# Patient Record
Sex: Male | Born: 1950 | Race: White | Hispanic: No | Smoking: Current some day smoker
Health system: Southern US, Community
[De-identification: ages and names within clinical notes are randomized; demographics above are authoritative.]

## PROBLEM LIST (undated history)

## (undated) DIAGNOSIS — H269 Unspecified cataract: Secondary | ICD-10-CM

## (undated) DIAGNOSIS — R569 Unspecified convulsions: Secondary | ICD-10-CM

## (undated) DIAGNOSIS — J449 Chronic obstructive pulmonary disease, unspecified: Secondary | ICD-10-CM

## (undated) DIAGNOSIS — F17200 Nicotine dependence, unspecified, uncomplicated: Secondary | ICD-10-CM

## (undated) HISTORY — PX: CATARACT EXTRACTION W/ INTRAOCULAR LENS  IMPLANT, BILATERAL: SHX1307

## (undated) HISTORY — DX: Chronic obstructive pulmonary disease, unspecified: J44.9

## (undated) HISTORY — DX: Unspecified convulsions: R56.9

## (undated) HISTORY — DX: Nicotine dependence, unspecified, uncomplicated: F17.200

## (undated) HISTORY — DX: Unspecified cataract: H26.9

---

## 1998-03-30 ENCOUNTER — Emergency Department (HOSPITAL_COMMUNITY): Admission: EM | Admit: 1998-03-30 | Discharge: 1998-03-30 | Payer: Self-pay | Admitting: Emergency Medicine

## 1998-03-30 ENCOUNTER — Encounter: Payer: Self-pay | Admitting: Emergency Medicine

## 1999-02-03 ENCOUNTER — Emergency Department (HOSPITAL_COMMUNITY): Admission: EM | Admit: 1999-02-03 | Discharge: 1999-02-03 | Payer: Self-pay | Admitting: Emergency Medicine

## 2013-01-26 ENCOUNTER — Encounter: Payer: Self-pay | Admitting: Family Medicine

## 2013-01-26 ENCOUNTER — Other Ambulatory Visit: Payer: Self-pay | Admitting: Family Medicine

## 2013-01-26 NOTE — Telephone Encounter (Signed)
Medication refill for one time only.  Patient needs to be seen.  Letter sent for patient to call and schedule 

## 2013-01-29 ENCOUNTER — Other Ambulatory Visit: Payer: Self-pay | Admitting: Family Medicine

## 2013-01-29 NOTE — Telephone Encounter (Signed)
This was refilled yesterday.

## 2013-01-31 ENCOUNTER — Other Ambulatory Visit: Payer: Self-pay | Admitting: Family Medicine

## 2013-02-01 ENCOUNTER — Other Ambulatory Visit: Payer: Self-pay | Admitting: Family Medicine

## 2013-02-01 NOTE — Telephone Encounter (Signed)
This is a quadruple request.  Pharmacy was called.  Did receive first request.  Told them stop resending

## 2013-02-01 NOTE — Telephone Encounter (Signed)
This has already been addressed

## 2013-02-01 NOTE — Telephone Encounter (Signed)
Pt NTBS  This was just refilled for one month and letter sent to pt to make appt.  Refill denied

## 2013-02-04 ENCOUNTER — Encounter: Payer: Self-pay | Admitting: Physician Assistant

## 2013-02-04 ENCOUNTER — Ambulatory Visit (INDEPENDENT_AMBULATORY_CARE_PROVIDER_SITE_OTHER): Payer: No Typology Code available for payment source | Admitting: Physician Assistant

## 2013-02-04 VITALS — BP 112/78 | HR 68 | Temp 98.5°F | Resp 18 | Ht 69.5 in | Wt 126.0 lb

## 2013-02-04 DIAGNOSIS — F17200 Nicotine dependence, unspecified, uncomplicated: Secondary | ICD-10-CM

## 2013-02-04 DIAGNOSIS — R569 Unspecified convulsions: Secondary | ICD-10-CM

## 2013-02-04 DIAGNOSIS — H269 Unspecified cataract: Secondary | ICD-10-CM | POA: Insufficient documentation

## 2013-02-04 MED ORDER — PHENYTOIN SODIUM EXTENDED 100 MG PO CAPS: 300.0000 mg | ORAL_CAPSULE | Freq: Every day | ORAL | Status: DC

## 2013-02-04 NOTE — Progress Notes (Signed)
   Patient ID: Franklin Flores MRN: [id number], DOB: 1950-12-25, 62 y.o. Date of Encounter: @DATE @  Chief Complaint:  Chief Complaint  Patient presents with  . 62 mth recheck for meds    HPI: 62 y.o. year oldwhite male  presents fro routine f/u. He has only been seen once at this office in the past. I saw him 07/06/12 to establish care here. Prior to that, he had been seeing Neurology-Dr. Jarrett Soho him sec to h/o seizures. Pt had no PCP. Dr. Anne Hahn told him he needed to have a PCP. Also told pt that PCP could monitor him regarding his seizure medication. He  Has had no seizure in well over 10 years, on Dilantin.  Today he reports he still has had no seizure over past year either.  He says he has completely stopped use of alcohol. He is in process of quitting smoking-on his own. Wass 1ppd LOV. Now < 2 packs per week. No complaints today.   Past Medical History  Diagnosis Date  . Seizures   . Smoker   . Cataract      Home Meds: See attached medication section for current medication list. Any medications entered into computer today will not appear on this note's list. The medications listed below were entered prior to today. No current outpatient prescriptions on file prior to visit.   No current facility-administered medications on file prior to visit.    Allergies: No Known Allergies  History   Social History  . Marital Status: Widowed    Spouse Name: N/A    Number of Children: N/A  . Years of Education: N/A   Occupational History  . Not on file.   Social History Main Topics  . Smoking status: Current Some Day Smoker  . Smokeless tobacco: Never Used  . Alcohol Use: Not on file  . Drug Use: No  . Sexually Active: Not on file   Other Topics Concern  . Not on file   Social History Narrative  . No narrative on file    No family history on file.   Review of Systems:  See HPI for pertinent ROS. All other ROS negative.    Physical Exam: Blood pressure 112/78, pulse  68, temperature 98.5 F (36.9 C), temperature source Oral, resp. rate 18, height 5' 9.5" (1.765 m), weight 126 lb (57.153 kg)., Body mass index is 18.35 kg/(m^2). General: Thin WM. Appears in no acute distress. Neck: Supple. No thyromegaly. No lymphadenopathy. Lungs: Clear bilaterally to auscultation without wheezes, rales, or rhonchi. Breathing is unlabored. Heart: RRR with S1 S2. No murmurs, rubs, or gallops. Musculoskeletal:  Strength and tone normal for age. Extremities/Skin: Warm and dry.  No edema.  Neuro: Alert and oriented X 3. Moves all extremities spontaneously. Gait is normal. CNII-XII grossly in tact. Psych:  Responds to questions appropriately with a normal affect.     ASSESSMENT AND PLAN:  62 y.o. year old male with  1. Seizures - phenytoin (DILANTIN) 100 MG ER capsule; Take 3 capsules (300 mg total) by mouth daily.  Dispense: 90 capsule; Refill: 11 - Phenytoin level, total  2. Smoker Handout given with tips for cessation.   Pt reports tha tNeuro only did lab once a year, only saw him for OV once a year. Wondring why we refused refill and why had to come for OV now.  F/U one year, sooner if needed.    [address] [place], [place], [facility] 02/04/2013 4:57 PM

## 2013-02-05 LAB — PHENYTOIN LEVEL, TOTAL: Phenytoin Lvl: 4.5 ug/mL — ABNORMAL LOW (ref 10.0–20.0)

## 2013-02-09 ENCOUNTER — Telehealth: Payer: Self-pay | Admitting: Family Medicine

## 2013-02-09 DIAGNOSIS — R569 Unspecified convulsions: Secondary | ICD-10-CM

## 2013-02-09 DIAGNOSIS — Z79899 Other long term (current) drug therapy: Secondary | ICD-10-CM

## 2013-02-09 MED ORDER — PHENYTOIN SODIUM EXTENDED 100 MG PO CAPS: 400.0000 mg | ORAL_CAPSULE | Freq: Every day | ORAL | Status: DC

## 2013-02-09 NOTE — Telephone Encounter (Signed)
Spoke to patient about low Dilantin level.  Understand to increase dose of Dilantin to 400 mg po daily.  Understands we need repeat dilantin levels in 1, 2 and 4 weeks.  Orders for med adjustment and lab work entered.

## 2013-02-09 NOTE — Telephone Encounter (Signed)
Message copied by Donne Anon on Tue Feb 09, 2013 12:48 PM ------      Message from: Allayne Butcher      Created: Mon Feb 08, 2013  7:38 AM       Pt's med list says he is taking Phenytoin 100mg  3 daily. (300mg ) . Verify that he has been taking this amount consistently, especially prior to lab draw. If he has been taking this, then have him increase to FOUR daily. Recheck level in 1 week, and again in 2 weeks, and in one month. If he has not been taking as directed, let me know. ------

## 2013-02-16 ENCOUNTER — Other Ambulatory Visit: Payer: No Typology Code available for payment source

## 2013-02-16 DIAGNOSIS — R569 Unspecified convulsions: Secondary | ICD-10-CM

## 2013-02-16 DIAGNOSIS — Z79899 Other long term (current) drug therapy: Secondary | ICD-10-CM

## 2013-02-19 ENCOUNTER — Telehealth: Payer: Self-pay | Admitting: Family Medicine

## 2013-02-19 NOTE — Telephone Encounter (Signed)
Message copied by Donne Anon on Fri Feb 19, 2013  9:45 AM ------      Message from: Allayne Butcher      Created: Wed Feb 17, 2013  3:22 PM       After last lab check, I increased dose slightly but now this level is even lower. Make sure he is taking increased does as directed. Get f/u labs as already planned. As well, see if he has gotten f/u apt with Neuro- I think we ordered this referral. ------

## 2013-02-19 NOTE — Telephone Encounter (Signed)
Pt called back.  Is taking increased Dilantin 400mg  QD in AM.  Does not have follow up appt with Dr Anne Hahn.  He had referred pt back to PCP because he was "stable"  And didn't feel needed to see any further.  Is scheduled for repeat labs on 03/02/13.

## 2013-02-19 NOTE — Telephone Encounter (Signed)
Ok. Cont this dose and recheck lab as planned

## 2013-02-19 NOTE — Telephone Encounter (Signed)
Message copied by Donne Anon on Fri Feb 19, 2013 12:40 PM ------      Message from: Allayne Butcher      Created: Wed Feb 17, 2013  3:22 PM       After last lab check, I increased dose slightly but now this level is even lower. Make sure he is taking increased does as directed. Get f/u labs as already planned. As well, see if he has gotten f/u apt with Neuro- I think we ordered this referral. ------

## 2013-02-23 ENCOUNTER — Telehealth: Payer: Self-pay | Admitting: Physician Assistant

## 2013-02-23 DIAGNOSIS — R569 Unspecified convulsions: Secondary | ICD-10-CM

## 2013-02-23 MED ORDER — PHENYTOIN SODIUM EXTENDED 100 MG PO CAPS: 400.0000 mg | ORAL_CAPSULE | Freq: Every day | ORAL | Status: DC

## 2013-02-23 NOTE — Telephone Encounter (Signed)
Medication refilled per protocol. 

## 2013-03-02 ENCOUNTER — Other Ambulatory Visit: Payer: No Typology Code available for payment source

## 2013-03-02 DIAGNOSIS — R569 Unspecified convulsions: Secondary | ICD-10-CM

## 2013-03-02 DIAGNOSIS — Z79899 Other long term (current) drug therapy: Secondary | ICD-10-CM

## 2013-03-02 LAB — PHENYTOIN LEVEL, TOTAL: Phenytoin Lvl: 3.7 ug/mL — ABNORMAL LOW (ref 10.0–20.0)

## 2013-03-03 ENCOUNTER — Telehealth: Payer: Self-pay | Admitting: Family Medicine

## 2013-03-03 NOTE — Telephone Encounter (Signed)
Called patient with dilantin level.  Per provider recommendation is to resume his 300 mg dose a day.  Still return for one month blood level draw.  He has not been having any seizure activity but told to report any activity to [place] immediately.  Pt acknowledges understanding of directions.  Also recommended that he ask his pharmacist to see if there is any interactions with anything he may be using (otc MVI/supplements) that can interfere with the absorption of his medication.

## 2013-03-03 NOTE — Telephone Encounter (Signed)
Message copied by Donne Anon on Wed Mar 03, 2013 12:35 PM ------      Message from: Allayne Butcher      Created: Wed Mar 03, 2013  7:59 AM       I discussed with Dr. Jeanice Lim.      She feels that if pt not having seizures (which he is not) then should cont current dose--go back to 300mg , dose that Neuro had him on-I think med list was changed to 400mg  based on prior lab level being low. Change this back to 300 mg. Just cont this dose. Make sure he is taking EVERY day and make sure to read package insert/discuss with pharmacist-to make sure something else he is taking or eating/drinking/supplements are not decreasing absorption of this medication-not sure why levels are reading so low. ------

## 2013-04-09 ENCOUNTER — Other Ambulatory Visit: Payer: Self-pay | Admitting: Physician Assistant

## 2013-04-09 NOTE — Telephone Encounter (Signed)
Medication refilled per protocol. 

## 2013-04-14 ENCOUNTER — Other Ambulatory Visit: Payer: Self-pay | Admitting: Physician Assistant

## 2013-04-14 NOTE — Telephone Encounter (Signed)
Medication refilled per protocol. 

## 2013-04-15 ENCOUNTER — Other Ambulatory Visit: Payer: Self-pay | Admitting: Physician Assistant

## 2013-04-15 NOTE — Telephone Encounter (Signed)
Medication refilled per protocol. 

## 2013-08-14 ENCOUNTER — Other Ambulatory Visit: Payer: Self-pay | Admitting: Physician Assistant

## 2013-08-14 DIAGNOSIS — G40909 Epilepsy, unspecified, not intractable, without status epilepticus: Secondary | ICD-10-CM

## 2013-08-16 NOTE — Telephone Encounter (Signed)
Medication refilled per protocol. 

## 2013-08-17 ENCOUNTER — Other Ambulatory Visit: Payer: Self-pay | Admitting: Physician Assistant

## 2013-08-18 ENCOUNTER — Other Ambulatory Visit: Payer: Self-pay | Admitting: Physician Assistant

## 2013-08-18 NOTE — Telephone Encounter (Signed)
This refill was done 08/16/13

## 2013-08-19 ENCOUNTER — Other Ambulatory Visit: Payer: Self-pay | Admitting: Physician Assistant

## 2013-08-19 NOTE — Telephone Encounter (Deleted)
Please

## 2013-08-19 NOTE — Telephone Encounter (Signed)
Refill appropriate and filled per protocol. 

## 2013-08-20 ENCOUNTER — Other Ambulatory Visit: Payer: Self-pay | Admitting: Physician Assistant

## 2013-08-20 NOTE — Telephone Encounter (Signed)
Agree. I double checked. Supposed to be on 300mg .

## 2013-08-20 NOTE — Telephone Encounter (Signed)
Pt is suppose to be on only 300mg  a day.  This was refilled on 08/16/13 #90 +2.    [facility] called and told not on 400mg .  Cancel those orders.  400mg  dose taken off medication list again!

## 2013-11-24 ENCOUNTER — Other Ambulatory Visit: Payer: Self-pay | Admitting: Physician Assistant

## 2013-11-25 ENCOUNTER — Other Ambulatory Visit: Payer: Self-pay | Admitting: Physician Assistant

## 2013-11-25 NOTE — Telephone Encounter (Signed)
Refill appropriate and filled per protocol. 

## 2013-11-26 NOTE — Telephone Encounter (Signed)
Medication refilled per protocol. 

## 2013-11-29 ENCOUNTER — Other Ambulatory Visit: Payer: Self-pay | Admitting: Physician Assistant

## 2013-11-29 NOTE — Telephone Encounter (Signed)
This was just refilled 11/26/13

## 2014-04-09 ENCOUNTER — Other Ambulatory Visit: Payer: Self-pay | Admitting: *Deleted

## 2014-04-09 MED ORDER — PHENYTOIN SODIUM EXTENDED 100 MG PO CAPS: ORAL_CAPSULE | ORAL | Status: DC

## 2014-04-09 NOTE — Telephone Encounter (Signed)
Medication filled x1 with no refills.   Requires office visit before any further refills can be given.   Letter sent.  

## 2014-04-11 ENCOUNTER — Encounter: Payer: Self-pay | Admitting: Family Medicine

## 2014-04-11 ENCOUNTER — Telehealth: Payer: Self-pay | Admitting: Family Medicine

## 2014-04-11 NOTE — Telephone Encounter (Signed)
Refill denied.  Pt last OV 01/2013.  NTBS  Letter was sent to him as reminder and no appt has been made.

## 2014-04-13 ENCOUNTER — Telehealth: Payer: Self-pay | Admitting: Family Medicine

## 2014-04-13 NOTE — Telephone Encounter (Signed)
med just refilled 10/10 verified with pharmacy

## 2014-04-27 ENCOUNTER — Ambulatory Visit (INDEPENDENT_AMBULATORY_CARE_PROVIDER_SITE_OTHER): Payer: No Typology Code available for payment source | Admitting: Physician Assistant

## 2014-04-27 ENCOUNTER — Encounter: Payer: Self-pay | Admitting: Physician Assistant

## 2014-04-27 VITALS — BP 126/80 | HR 68 | Temp 98.5°F | Resp 18 | Ht 69.5 in | Wt 125.0 lb

## 2014-04-27 DIAGNOSIS — R569 Unspecified convulsions: Secondary | ICD-10-CM

## 2014-04-27 DIAGNOSIS — F17200 Nicotine dependence, unspecified, uncomplicated: Secondary | ICD-10-CM

## 2014-04-27 DIAGNOSIS — Z720 Tobacco use: Secondary | ICD-10-CM

## 2014-04-27 MED ORDER — PHENYTOIN SODIUM EXTENDED 100 MG PO CAPS: ORAL_CAPSULE | ORAL | Status: DC

## 2014-04-27 NOTE — Progress Notes (Signed)
Patient ID: Franklin Flores MRN: [id number], DOB: 06-11-1951, 63 y.o. Date of Encounter: @DATE @  Chief Complaint:  Chief Complaint  Patient presents with  . med refills    hasn't been seen in over a year, has had cold  OK flu shot?    HPI: 63 y.o. year old white male  presents for routine f/u.   He has only been seen two times in this office in the past.   I saw him 07/06/12 to establish care here. Prior to that, he had been seeing Neurology-Dr. Jarrett SohoWillis--saw him sec to h/o seizures. Pt had no PCP. Dr. Anne HahnWillis told him he needed to have a PCP. Also told pt that PCP could monitor him regarding his seizure medication. He has had no seizure in well over 10 years, on Dilantin.   He had a 2nd OV with me 02/04/2013.That day he reported that he still had had no seizure over past year either.  He reported that  he had completely stopped use of alcohol.  Also he was in process of quitting smoking-on his own. Had been 1ppd at time of OV 07/2012. At OV 01/2013 was at  < 2 packs per week.  Today--04/27/2014--he states that he is taking his Dilantin as directed. He takes 3 pills each day and takes them every morning. He states that he still has had no seizure over the past year and has had no seizures and well over 12 years now. He is having no adverse effects to the Dilantin. He has no complaints today at all. I asked about the smoking and he says he is currently smoking 1/2-1 pack per day.     Past Medical History  Diagnosis Date  . Seizures   . Smoker   . Cataract      Home Meds:  Outpatient Prescriptions Prior to Visit  Medication Sig Dispense Refill  . Multiple Vitamins-Minerals (MULTIVITAMIN PO) Take 1 tablet by mouth daily. Centrum silver      . phenytoin (DILANTIN) 100 MG ER capsule TAKE 3 CAPSULES (300 MG TOTAL) BY MOUTH DAILY.  90 capsule  0   No facility-administered medications prior to visit.     Allergies: No Known Allergies  History   Social History  . Marital Status:  Widowed    Spouse Name: N/A    Number of Children: N/A  . Years of Education: N/A   Occupational History  . Not on file.   Social History Main Topics  . Smoking status: Current Some Day Smoker  . Smokeless tobacco: Never Used  . Alcohol Use: Not on file  . Drug Use: No  . Sexual Activity: Not on file   Other Topics Concern  . Not on file   Social History Narrative  . No narrative on file    No family history on file.   Review of Systems:  See HPI for pertinent ROS. All other ROS negative.    Physical Exam: Blood pressure 126/80, pulse 68, temperature 98.5 F (36.9 C), temperature source Oral, resp. rate 18, height 5' 9.5" (1.765 m), weight 125 lb (56.7 kg)., Body mass index is 18.2 kg/(m^2). General: Thin WM. Appears in no acute distress. Neck: Supple. No thyromegaly. No lymphadenopathy. Lungs: Clear bilaterally to auscultation without wheezes, rales, or rhonchi. Breathing is unlabored. Heart: RRR with S1 S2. No murmurs, rubs, or gallops. Musculoskeletal:  Strength and tone normal for age. Extremities/Skin: Warm and dry.  No edema.  Neuro: Alert and oriented X 3. Moves all extremities spontaneously.  Gait is normal. CNII-XII grossly in tact. Psych:  Responds to questions appropriately with a normal affect.     ASSESSMENT AND PLAN:  63 y.o. year old male with  1. Seizures - phenytoin (DILANTIN) 100 MG ER capsule; Take 3 capsules (300 mg total) by mouth daily.  Dispense: 90 capsule; Refill: 11 - Phenytoin level, total  2. Smoker Handout given with tips for cessation.   In past I only prescribed enough Dilantin to last 6 months. At f/u OV he reported that Neuro only did lab once a year, only saw him for OV once a year. Was wondering why we refused refill and why he had to come for OV 6 months after prior OV. Since then, have been seeing on annual basis.  Today--04/27/2014--- I discussed with patient the fact that so far he has been coming in for us to manage just this  one issue. Discussed with him scheduling a complete physical exams that we could check on other medical issues and do some preventive care. He is agreeable to come back for complete physical. States that he usually works 6:30 AM to 6:30 PM at least 5 days a week but does state that he does have time available to take a day off as needed. Discussed him scheduling his complete physical exam for early morning and come fasting to that appointment so that we can do his labs at the same time as that visit to minimize him taking time off work. He is agreeable with this and scheduled early morning CPE while here today.   [address]igned, Dakin Madani Beth [place], [place], [facility] 04/27/2014 4:41 PM

## 2014-04-29 LAB — PHENYTOIN LEVEL, TOTAL: Phenytoin Lvl: 3.3 ug/mL — ABNORMAL LOW (ref 10.0–20.0)

## 2014-05-06 ENCOUNTER — Telehealth: Payer: Self-pay | Admitting: Physician Assistant

## 2014-05-06 NOTE — Telephone Encounter (Signed)
Patient is calling us about his lab results  [phone]

## 2014-05-06 NOTE — Telephone Encounter (Signed)
Pt aware of lab results and provider recommendations 

## 2014-05-06 NOTE — Telephone Encounter (Signed)
-----   Message from Dorena BodoMary B Dixon, PA-C sent at 05/02/2014  7:59 AM EST ----- Phenytoin Level is low but stable compared to prior levels and pt has had no seizure in > 12 years.  Continue current dose. (FYI--At prior lab 1 year ago, I discussed it with Dr. Jeanice Limurham and she did not recommend increasing dose. Recommended cont dose the same)

## 2014-05-30 ENCOUNTER — Ambulatory Visit
Admission: RE | Admit: 2014-05-30 | Discharge: 2014-05-30 | Disposition: A | Discharge status: Home / Self Care | Payer: PRIVATE HEALTH INSURANCE | Source: Ambulatory Visit | Attending: Physician Assistant | Admitting: Physician Assistant

## 2014-05-30 ENCOUNTER — Ambulatory Visit (INDEPENDENT_AMBULATORY_CARE_PROVIDER_SITE_OTHER): Payer: No Typology Code available for payment source | Admitting: Physician Assistant

## 2014-05-30 ENCOUNTER — Telehealth: Payer: Self-pay | Admitting: *Deleted

## 2014-05-30 ENCOUNTER — Encounter: Payer: Self-pay | Admitting: Physician Assistant

## 2014-05-30 ENCOUNTER — Other Ambulatory Visit: Payer: Self-pay | Admitting: Physician Assistant

## 2014-05-30 VITALS — BP 136/78 | HR 72 | Temp 97.9°F | Resp 18 | Ht 69.0 in | Wt 122.0 lb

## 2014-05-30 DIAGNOSIS — R9389 Abnormal findings on diagnostic imaging of other specified body structures: Secondary | ICD-10-CM

## 2014-05-30 DIAGNOSIS — H269 Unspecified cataract: Secondary | ICD-10-CM

## 2014-05-30 DIAGNOSIS — Z23 Encounter for immunization: Secondary | ICD-10-CM

## 2014-05-30 DIAGNOSIS — F17200 Nicotine dependence, unspecified, uncomplicated: Secondary | ICD-10-CM

## 2014-05-30 DIAGNOSIS — J439 Emphysema, unspecified: Secondary | ICD-10-CM

## 2014-05-30 DIAGNOSIS — Z0000 Encounter for general adult medical examination without abnormal findings: Secondary | ICD-10-CM

## 2014-05-30 DIAGNOSIS — Z720 Tobacco use: Secondary | ICD-10-CM

## 2014-05-30 DIAGNOSIS — J449 Chronic obstructive pulmonary disease, unspecified: Secondary | ICD-10-CM | POA: Insufficient documentation

## 2014-05-30 DIAGNOSIS — R569 Unspecified convulsions: Secondary | ICD-10-CM

## 2014-05-30 LAB — TSH: TSH: 1.591 u[IU]/mL (ref 0.350–4.500)

## 2014-05-30 LAB — COMPLETE METABOLIC PANEL WITH GFR
ALT: 14 U/L (ref 0–53)
AST: 18 U/L (ref 0–37)
Albumin: 4.5 g/dL (ref 3.5–5.2)
Alkaline Phosphatase: 127 U/L — ABNORMAL HIGH (ref 39–117)
BUN: 14 mg/dL (ref 6–23)
CALCIUM: 10.1 mg/dL (ref 8.4–10.5)
CHLORIDE: 100 meq/L (ref 96–112)
CO2: 30 mEq/L (ref 19–32)
CREATININE: 0.74 mg/dL (ref 0.50–1.35)
GFR, Est Non African American: >89 mL/min
Glucose, Bld: 113 mg/dL — ABNORMAL HIGH (ref 70–99)
Potassium: 5.9 mEq/L — ABNORMAL HIGH (ref 3.5–5.3)
Sodium: 138 mEq/L (ref 135–145)
Total Bilirubin: 0.4 mg/dL (ref 0.2–1.2)
Total Protein: 7.2 g/dL (ref 6.0–8.3)

## 2014-05-30 LAB — CBC WITH DIFFERENTIAL/PLATELET
Basophils Absolute: 0.1 10*3/uL (ref 0.0–0.1)
Basophils Relative: 1 % (ref 0–1)
EOS ABS: 0.5 10*3/uL (ref 0.0–0.7)
Eosinophils Relative: 6 % — ABNORMAL HIGH (ref 0–5)
HEMATOCRIT: 44.9 % (ref 39.0–52.0)
Hemoglobin: 16.3 g/dL (ref 13.0–17.0)
LYMPHS ABS: 2.1 10*3/uL (ref 0.7–4.0)
Lymphocytes Relative: 27 % (ref 12–46)
MCH: 34.1 pg — ABNORMAL HIGH (ref 26.0–34.0)
MCHC: 36.3 g/dL — ABNORMAL HIGH (ref 30.0–36.0)
MCV: 93.9 fL (ref 78.0–100.0)
MONO ABS: 0.8 10*3/uL (ref 0.1–1.0)
MONOS PCT: 10 % (ref 3–12)
MPV: 9.7 fL (ref 9.4–12.4)
Neutro Abs: 4.3 10*3/uL (ref 1.7–7.7)
Neutrophils Relative %: 56 % (ref 43–77)
Platelets: 266 10*3/uL (ref 150–400)
RBC: 4.78 MIL/uL (ref 4.22–5.81)
RDW: 13.8 % (ref 11.5–15.5)
WBC: 7.7 10*3/uL (ref 4.0–10.5)

## 2014-05-30 LAB — LIPID PANEL
Cholesterol: 240 mg/dL — ABNORMAL HIGH (ref 0–200)
HDL: 79 mg/dL (ref >39)
LDL CALC: 149 mg/dL — AB (ref 0–99)
TRIGLYCERIDES: 59 mg/dL (ref <150)
Total CHOL/HDL Ratio: 3 Ratio
VLDL: 12 mg/dL (ref 0–40)

## 2014-05-30 NOTE — Progress Notes (Signed)
Patient ID: Franklin Flores MRN: [id number], DOB: [birth year] 63 y.o. Date of Encounter: 05/30/2014, 8:34 AM    Chief Complaint: Physical (CPE)  HPI: 63 y.o. y/o white male here for CPE.   Prior to today's visit, he has always been seen here just to follow-up regarding his seizures and to refill his medication for seizures.  I saw him 07/06/2012 to establish care here. Prior to that, he had been seeing Dr. Anne Hahn at [facility] neurology secondary to history of seizures. Patient had no PCP. Dr. Anne Hahn told him he needed to have a PCP. He also told him that the PCP could monitor him regarding the seizure medication. He had had no seizure in well over 10 years, on Dilantin.  At his follow-up visits with me he has continued to report that he has continued to have no seizures.  Also at his visits with me at each visit he has been decreasing the amount of cigarette smoking. Today he says that he is down to one half pack a day. Says that he recently moved into a new house and he is not smoking in the house that that really limits the amount of smoking.  He says that he did drink alcohol fairly heavily in the past but says that that was over 30 years ago that he was drinking heavily. Then tapered off of the other liquor etc. down to only beer and he has not even had any beer in the past year.  He does have a very thin stature but says that he has always been thin and has always been this weight.   Review of Systems: Consitutional: No fever, chills, fatigue, night sweats, lymphadenopathy, or weight changes. Eyes: No visual changes, eye redness, or discharge. ENT/Mouth: Ears: No otalgia, tinnitus, hearing loss, discharge. Nose: No congestion, rhinorrhea, sinus pain, or epistaxis. Throat: No sore throat, post nasal drip, or teeth pain. Cardiovascular: No CP, palpitations, diaphoresis, DOE, edema, orthopnea, PND. Respiratory: No cough, hemoptysis, SOB, or wheezing. Gastrointestinal: No anorexia,  dysphagia, reflux, pain, nausea, vomiting, hematemesis, diarrhea, constipation, BRBPR, or melena. Genitourinary: No dysuria, frequency, urgency, hematuria, incontinence, nocturia, decreased urinary stream, discharge, impotence, or testicular pain/masses. Musculoskeletal: No decreased ROM, myalgias, stiffness, joint swelling, or weakness. Skin: No rash, erythema, lesion changes, pain, warmth, jaundice, or pruritis. Neurological: No headache, dizziness, syncope, seizures, tremors, memory loss, coordination problems, or paresthesias. Psychological: No anxiety, depression, hallucinations, SI/HI. Endocrine: No fatigue, polydipsia, polyphagia, polyuria, or known diabetes. All other systems were reviewed and are otherwise negative.  Past Medical History  Diagnosis Date  . Seizures   . Smoker   . Cataract   . Smoker   . COPD (chronic obstructive pulmonary disease)      Past Surgical History  Procedure Laterality Date  . Cataract extraction w/ intraocular lens  implant, bilateral Bilateral 07/02/2003, 07/01/2005    Dr. Stephannie Li    Home Meds:  Outpatient Prescriptions Prior to Visit  Medication Sig Dispense Refill  . Multiple Vitamins-Minerals (MULTIVITAMIN PO) Take 1 tablet by mouth daily. Centrum silver    . phenytoin (DILANTIN) 100 MG ER capsule TAKE 3 CAPSULES (300 MG TOTAL) BY MOUTH DAILY. 90 capsule 11   No facility-administered medications prior to visit.    Allergies: No Known Allergies  History   Social History  . Marital Status: Widowed    Spouse Name: N/A    Number of Children: N/A  . Years of Education: N/A   Occupational History  . Not on file.   Social  History Main Topics  . Smoking status: Current Some Day Smoker -- 0.50 packs/day for 47 years  . Smokeless tobacco: Never Used  . Alcohol Use: No  . Drug Use: No  . Sexual Activity: Not on file   Other Topics Concern  . Not on file   Social History Narrative   Entered 05/2014:      Married.   Says  he has no children with this wife.   Says he has a daughter who is almost 61 years old.   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   Usually works 6:30 AM to 6:30 PM at least 5 days a week.   Has DOT Physical every 4 years.      He says that he drank alcohol heavily about 30 years ago. Then tapered off of liquor and was only drinking beer. Has drank no beer or any type of alcohol in the past one year.      He has been decreasing his amount of smoking. To one half pack per day now.     Family History  Problem Relation Age of Onset  . Drug abuse Brother   . Alcohol abuse Brother     Physical Exam: Blood pressure 136/78, pulse 72, temperature 97.9 F (36.6 C), temperature source Oral, resp. rate 18, height 5\' 9"  (1.753 m), weight 122 lb (55.339 kg).  General: Thin WM. Appears in no acute distress. HEENT: Normocephalic, atraumatic. Conjunctiva pink, sclera non-icteric. Pupils 2 mm constricting to 1 mm, round, regular, and equally reactive to light and accomodation. EOMI. Internal auditory canal clear. TMs with good cone of light and without pathology. Nasal mucosa pink. Nares are without discharge. No sinus tenderness. Oral mucosa pink. Pharynx without exudate.   Neck: Supple. Trachea midline. No thyromegaly. Full ROM. No lymphadenopathy.No carotid bruits.  Lungs: Breath sounds are distant/decreased throughout but clear. I hear no wheezes or rhonchi. Cardiovascular: RRR with S1 S2. No murmurs, rubs, or gallops. Distal pulses 2+ symmetrically. No carotid or abdominal bruits.2+ Bilateral Posterior Tibial Pulses. No LE edema.  Abdomen: Soft, non-tender, non-distended with normoactive bowel sounds. No hepatosplenomegaly or masses. No rebound/guarding. No CVA tenderness. No hernias. He has  protrusion of a right low rib. He says that this is secondary to a past rib fracture that healed this way. Rectal:Deferred Musculoskeletal: Full range of motion and 5/5 strength throughout. Without  swelling. Skin: Warm and moist without erythema, ecchymosis, wounds, or rash. Neuro: A+Ox3. CN II-XII grossly intact. Moves all extremities spontaneously. Full sensation throughout. Normal gait.  Psych:  Responds to questions appropriately with a normal affect.   Assessment/Plan:  63 y.o. y/o [place] here for CPE -1. Visit for preventive health examination  A. Screening Labs:  - CBC with Differential - COMPLETE METABOLIC PANEL WITH GFR - Lipid panel - TSH - Vit D  25 hydroxy (rtn osteoporosis monitoring) - PSA  B. Screening For Prostate Cancer: - PSA  C. Screening For Colorectal Cancer:  He has never had a screening colonoscopy. Today I discussed this with him and explained it to him. However, because of his work schedule and the fact that he does not have someone to drive him to and from the colonoscopy procedure he does not want us to schedule this right now. I told him that if he is able to plan days off of work and arrange for transportation for the procedure to call me and we can do the referral 90 any time.  Also discussed Hemoccults stools  that deferred at this time.  D. Screening for Lung Cancer--Smoker Given his smoking history I recommend chest x-ray and he is agreeable. - DG Chest 2 View; Future    D. Immunizations: Flu------- he received influenza vaccine 05/17/2014 Tetanus--- he has had no T gap in well over 10 years and is agreeable to receive this today. DTaP given here 05/30/14 Pneumococcal--- he has received no pneumonia vaccine. Given that he is a smoker he needs to receive Pneumovax 23 today and then get the Prevnar 13 at age 63. He is agreeable. Pneumovax 23 given here 05/30/14 Zostavax--I wrote down on his AVS for him to call his insurance and find out the cost of the Zostavax/shingles vaccine. If he is agreeable to pay this price than he is to call us and we will send order to his pharmacy for him to receive the vaccine there.    2. Seizures He  just had office visit regarding this and phenytoin lab level drawn on 04/27/14. He is continued to have no seizures.  3. Smoker I have given him a handout with tips for cessation at past visits. Can not prescribe medications to help with smoking cessation given his history of seizures and his seizure medication. - DG Chest 2 View; Future  4. Pulmonary emphysema, unspecified emphysema type - DG Chest 2 View; Future  5. Cataract He says that he has had cataract extraction and lens implants on both eyes performed. Says that he currently only sees ophthalmologist every 4 years,"when due for his DOT" Recommended annual follow-up with ophthalmologist.   Follow-up for routine office visit in 6 months. At that visit in addition to checking a phenytoin level will rediscuss Hemoccult cards colonoscopy and the Zostavax to follow these issues.  Signed:   [address]Mary Beth Bald Kn[place], [place]  05/30/2014 8:34 AM

## 2014-05-30 NOTE — Telephone Encounter (Signed)
I have reviewed chest x-ray report. I have discussed with our referral nurse and she has discussed with the patient and is in the process of scheduling the CT scan.

## 2014-05-30 NOTE — Telephone Encounter (Signed)
Received call from [facility] with a call report stating that pt has chest xray and the impression was Emphysema and probable 8mm  Left upper lobe pulm nodule, recommends CT wo cm as a f/u.

## 2014-05-31 LAB — VITAMIN D 25 HYDROXY (VIT D DEFICIENCY, FRACTURES): Vit D, 25-Hydroxy: 28 ng/mL — ABNORMAL LOW (ref 30–100)

## 2014-05-31 LAB — PSA: PSA: 0.26 ng/mL (ref <=4.00)

## 2014-06-07 ENCOUNTER — Ambulatory Visit (HOSPITAL_COMMUNITY)
Admission: RE | Admit: 2014-06-07 | Discharge: 2014-06-07 | Disposition: A | Discharge status: Home / Self Care | Payer: PRIVATE HEALTH INSURANCE | Source: Ambulatory Visit | Attending: Physician Assistant | Admitting: Physician Assistant

## 2014-06-07 DIAGNOSIS — R918 Other nonspecific abnormal finding of lung field: Secondary | ICD-10-CM | POA: Diagnosis not present

## 2014-06-07 DIAGNOSIS — R9389 Abnormal findings on diagnostic imaging of other specified body structures: Secondary | ICD-10-CM

## 2014-06-13 ENCOUNTER — Telehealth: Payer: Self-pay | Admitting: Family Medicine

## 2014-06-13 DIAGNOSIS — J449 Chronic obstructive pulmonary disease, unspecified: Secondary | ICD-10-CM

## 2014-06-13 MED ORDER — TIOTROPIUM BROMIDE MONOHYDRATE 18 MCG IN CAPS: 18.0000 ug | ORAL_CAPSULE | Freq: Every day | RESPIRATORY_TRACT | Status: DC

## 2014-06-13 NOTE — Telephone Encounter (Signed)
Pt made aware of lab, CXR and CT results.  Given provider recommendations.  Rx for Spiriva to pharmacy

## 2014-06-13 NOTE — Telephone Encounter (Signed)
-----   Message from Dorena BodoMary B Dixon, PA-C sent at 06/08/2014 11:00 AM EST ----- Good News!! CT only shows COPD--no other abnormality.  Regarding the COPD--can start medication for this--Spiriva--1 inhalation daily #1 --11 refills. Also see Result Note attached to labs when call pt to avoid 2 phone calls.

## 2014-06-13 NOTE — Telephone Encounter (Signed)
-----   Message from Dorena BodoMary B Dixon, PA-C sent at 06/08/2014 11:00 AM EST ----- F/U CT was performed and result in Epic.

## 2014-06-13 NOTE — Telephone Encounter (Signed)
-----   Message from Dorena BodoMary B Dixon, PA-C sent at 06/08/2014 11:06 AM EST ----- WHEN CALL PT WITH THESE RESULTS, ALSO DISCUSS CXR AND CHEST CT RESULTS--- Tell patient that labs look good. 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 Regarding his cholesterol--tell him to decrease saturated fats in his diet including fried foods, butter, whole cream products.(his only CRF is male, age, smoker) Remainder of labs normal.

## 2015-05-04 ENCOUNTER — Other Ambulatory Visit: Payer: Self-pay | Admitting: Physician Assistant

## 2015-05-05 ENCOUNTER — Encounter: Payer: Self-pay | Admitting: Family Medicine

## 2015-05-05 NOTE — Telephone Encounter (Signed)
Medication refill for one time only.  Patient needs to be seen.  Letter sent for patient to call and schedule 

## 2015-05-17 ENCOUNTER — Telehealth: Payer: Self-pay | Admitting: Physician Assistant

## 2015-05-17 MED ORDER — PHENYTOIN SODIUM EXTENDED 100 MG PO CAPS: 300.0000 mg | ORAL_CAPSULE | Freq: Every day | ORAL | Status: DC

## 2015-05-17 NOTE — Telephone Encounter (Signed)
Medication refilled per protocol. 

## 2015-05-17 NOTE — Telephone Encounter (Signed)
Patient needs additional refill on his dilantin until his appointment in December if possible  [facility] [address] [phone] (H)

## 2015-05-29 ENCOUNTER — Other Ambulatory Visit: Payer: No Typology Code available for payment source

## 2015-05-30 ENCOUNTER — Other Ambulatory Visit: Payer: Self-pay | Admitting: Physician Assistant

## 2015-05-30 ENCOUNTER — Other Ambulatory Visit: Payer: Managed Care, Other (non HMO)

## 2015-05-30 DIAGNOSIS — Z0000 Encounter for general adult medical examination without abnormal findings: Secondary | ICD-10-CM

## 2015-05-30 DIAGNOSIS — Z79899 Other long term (current) drug therapy: Secondary | ICD-10-CM

## 2015-05-30 DIAGNOSIS — G40909 Epilepsy, unspecified, not intractable, without status epilepticus: Secondary | ICD-10-CM

## 2015-05-30 LAB — LIPID PANEL
CHOLESTEROL: 167 mg/dL (ref 125–200)
HDL: 93 mg/dL (ref >=40)
LDL CALC: 65 mg/dL (ref <130)
TRIGLYCERIDES: 47 mg/dL (ref <150)
Total CHOL/HDL Ratio: 1.8 Ratio (ref <=5.0)
VLDL: 9 mg/dL (ref <30)

## 2015-05-30 LAB — COMPLETE METABOLIC PANEL WITH GFR
ALBUMIN: 4.1 g/dL (ref 3.6–5.1)
ALK PHOS: 117 U/L — AB (ref 40–115)
ALT: 12 U/L (ref 9–46)
AST: 18 U/L (ref 10–35)
BUN: 14 mg/dL (ref 7–25)
CALCIUM: 9.3 mg/dL (ref 8.6–10.3)
CO2: 27 mmol/L (ref 20–31)
Chloride: 99 mmol/L (ref 98–110)
Creat: 0.78 mg/dL (ref 0.70–1.25)
GFR, Est African American: >89 mL/min (ref >=60)
Glucose, Bld: 89 mg/dL (ref 70–99)
POTASSIUM: 4.6 mmol/L (ref 3.5–5.3)
SODIUM: 136 mmol/L (ref 135–146)
Total Bilirubin: 0.4 mg/dL (ref 0.2–1.2)
Total Protein: 6.6 g/dL (ref 6.1–8.1)

## 2015-05-30 LAB — CBC WITH DIFFERENTIAL/PLATELET
BASOS PCT: 1 % (ref 0–1)
Basophils Absolute: 0.1 10*3/uL (ref 0.0–0.1)
Eosinophils Absolute: 0.2 10*3/uL (ref 0.0–0.7)
Eosinophils Relative: 2 % (ref 0–5)
HEMATOCRIT: 42.5 % (ref 39.0–52.0)
HEMOGLOBIN: 14.8 g/dL (ref 13.0–17.0)
LYMPHS ABS: 1.5 10*3/uL (ref 0.7–4.0)
Lymphocytes Relative: 18 % (ref 12–46)
MCH: 34.1 pg — AB (ref 26.0–34.0)
MCHC: 34.8 g/dL (ref 30.0–36.0)
MCV: 97.9 fL (ref 78.0–100.0)
MONO ABS: 0.6 10*3/uL (ref 0.1–1.0)
MONOS PCT: 8 % (ref 3–12)
MPV: 9.7 fL (ref 8.6–12.4)
NEUTROS ABS: 5.8 10*3/uL (ref 1.7–7.7)
NEUTROS PCT: 71 % (ref 43–77)
Platelets: 255 10*3/uL (ref 150–400)
RBC: 4.34 MIL/uL (ref 4.22–5.81)
RDW: 13.5 % (ref 11.5–15.5)
WBC: 8.1 10*3/uL (ref 4.0–10.5)

## 2015-05-30 LAB — TSH: TSH: 1.098 u[IU]/mL (ref 0.350–4.500)

## 2015-05-30 NOTE — Telephone Encounter (Signed)
Medication refilled per protocol. 

## 2015-05-31 LAB — PHENYTOIN LEVEL, TOTAL: Phenytoin Lvl: 3.6 ug/mL — ABNORMAL LOW (ref 10.0–20.0)

## 2015-06-01 ENCOUNTER — Encounter: Payer: Self-pay | Admitting: Physician Assistant

## 2015-06-01 ENCOUNTER — Ambulatory Visit (INDEPENDENT_AMBULATORY_CARE_PROVIDER_SITE_OTHER): Payer: Managed Care, Other (non HMO) | Admitting: Physician Assistant

## 2015-06-01 VITALS — BP 130/70 | HR 68 | Temp 98.4°F | Resp 18 | Ht 70.5 in | Wt 121.0 lb

## 2015-06-01 DIAGNOSIS — J439 Emphysema, unspecified: Secondary | ICD-10-CM

## 2015-06-01 DIAGNOSIS — Z0000 Encounter for general adult medical examination without abnormal findings: Secondary | ICD-10-CM | POA: Diagnosis not present

## 2015-06-01 DIAGNOSIS — R569 Unspecified convulsions: Secondary | ICD-10-CM

## 2015-06-01 DIAGNOSIS — F17200 Nicotine dependence, unspecified, uncomplicated: Secondary | ICD-10-CM

## 2015-06-01 DIAGNOSIS — Z720 Tobacco use: Secondary | ICD-10-CM

## 2015-06-01 NOTE — Progress Notes (Signed)
Patient ID: Franklin Flores MRN: [id number], DOB: 1950-09-27 64 y.o. Date of Encounter: 06/01/2015, 8:13 AM    Chief Complaint: Physical (CPE)  HPI: 64 y.o. y/o white male here for CPE.   Prior to his CPE 05/30/2014,  he had always been seen here just to follow-up regarding his seizures and to refill his medication for seizures.  I saw him 07/06/2012 to establish care here. Prior to that, he had been seeing Dr. Anne Hahn at [facility] secondary to history of seizures.  Patient had no PCP.      Dr. Anne Hahn told him he needed to have a PCP.   He also told him that the PCP could monitor him regarding the seizure medication.  He had had no seizure in well over 10 years, on Dilantin.  At his follow-up visits with me he has continued to report that he has continued to have no seizures.  Also at his visits with me at each visit he has been decreasing the amount of cigarette smoking. 05/30/2014-- he says that he is down to one half pack a day.                      Says that he recently moved into a new house and he is not smoking in the house that that really limits the amount of smoking. 06/01/2015--says that he has continued to decrease the smoking and is smoking less than half a pack a day now.  He says that he did drink alcohol fairly heavily in the past but says that that was over 30 years ago that he was drinking heavily. Then tapered off of the other liquor etc. down to only beer and he has not even had any beer in the past year.  He does have a very thin stature but says that he has always been thin and has always been this weight.  He has no specific complaints or concerns today. 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 Drives a box truck locally.   Review of Systems: Consitutional: No fever, chills, fatigue, night sweats, lymphadenopathy, or weight changes. Eyes: No visual changes, eye redness, or discharge. ENT/Mouth: Ears: No otalgia, tinnitus, hearing loss, discharge. Nose:  No congestion, rhinorrhea, sinus pain, or epistaxis. Throat: No sore throat, post nasal drip, or teeth pain. Cardiovascular: No CP, palpitations, diaphoresis, DOE, edema, orthopnea, PND. Respiratory: No cough, hemoptysis, SOB, or wheezing. Gastrointestinal: No anorexia, dysphagia, reflux, pain, nausea, vomiting, hematemesis, diarrhea, constipation, BRBPR, or melena. Genitourinary: No dysuria, frequency, urgency, hematuria, incontinence, nocturia, decreased urinary stream, discharge, impotence, or testicular pain/masses. Musculoskeletal: No decreased ROM, myalgias, stiffness, joint swelling, or weakness. Skin: No rash, erythema, lesion changes, pain, warmth, jaundice, or pruritis. Neurological: No headache, dizziness, syncope, seizures, tremors, memory loss, coordination problems, or paresthesias. Psychological: No anxiety, depression, hallucinations, SI/HI. Endocrine: No fatigue, polydipsia, polyphagia, polyuria, or known diabetes. All other systems were reviewed and are otherwise negative.  Past Medical History  Diagnosis Date  . Seizures   . Smoker   . Cataract   . Smoker   . COPD (chronic obstructive pulmonary disease)      Past Surgical History  Procedure Laterality Date  . Cataract extraction w/ intraocular lens  implant, bilateral Bilateral 07/02/2003, 07/01/2005    Dr. Stephannie Li    Home Meds:  Outpatient Prescriptions Prior to Visit  Medication Sig Dispense Refill  . Multiple Vitamins-Minerals (MULTIVITAMIN PO) Take 1 tablet by mouth daily. Centrum silver    . phenytoin (  DILANTIN) 100 MG ER capsule Take 3 capsules (300 mg total) by mouth daily. 90 capsule 0  . SPIRIVA HANDIHALER 18 MCG inhalation capsule PLACE 1 CAPSULE (18 MCG TOTAL) INTO INHALER AND INHALE DAILY. 30 capsule 5   No facility-administered medications prior to visit.    Allergies: No Known Allergies  Social History   Social History  . Marital Status: Widowed    Spouse Name: N/A  . Number of  Children: N/A  . Years of Education: N/A   Occupational History  . Not on file.   Social History Main Topics  . Smoking status: Current Some Day Smoker -- 0.50 packs/day for 47 years  . Smokeless tobacco: Never Used  . Alcohol Use: No  . Drug Use: No  . Sexual Activity: Not on file   Other Topics Concern  . Not on file   Social History Narrative   Entered 05/2014:      Married.   Says he has no children with this wife.   Says he has a daughter who is almost 62 years old.   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   Usually works 6:30 AM to 6:30 PM at least 5 days a week.   Has DOT Physical every 4 years.      He says that he drank alcohol heavily about 30 years ago. Then tapered off of liquor and was only drinking beer. Has drank no beer or any type of alcohol in the past one year.      He has been decreasing his amount of smoking. To one half pack per day now.     Family History  Problem Relation Age of Onset  . Drug abuse Brother   . Alcohol abuse Brother     Physical Exam: Blood pressure 130/70, pulse 68, temperature 98.4 F (36.9 C), temperature source Oral, resp. rate 18, height 5' 10.5" (1.791 m), weight 121 lb (54.885 kg).  General: Thin WM. Appears in no acute distress. HEENT: Normocephalic, atraumatic. Conjunctiva pink, sclera non-icteric. Pupils 2 mm constricting to 1 mm, round, regular, and equally reactive to light and accomodation. EOMI. Internal auditory canal clear. TMs with good cone of light and without pathology. Nasal mucosa pink. Nares are without discharge. No sinus tenderness. Oral mucosa pink. Pharynx without exudate.   Neck: Supple. Trachea midline. No thyromegaly. Full ROM. No lymphadenopathy.No carotid bruits.  Lungs: Breath sounds are distant/decreased throughout but clear. I hear no wheezes or rhonchi. Cardiovascular: RRR with S1 S2. No murmurs, rubs, or gallops. Distal pulses 2+ symmetrically. No carotid or abdominal bruits.2+ Bilateral  Posterior Tibial Pulses. No LE edema.  Abdomen: Soft, non-tender, non-distended with normoactive bowel sounds. No hepatosplenomegaly or masses. No rebound/guarding. No CVA tenderness. No hernias. He has  protrusion of a right low rib. He says that this is secondary to a past rib fracture that healed this way. Rectal:Deferred Musculoskeletal: Full range of motion and 5/5 strength throughout. Without swelling. Skin: Warm and moist without erythema, ecchymosis, wounds, or rash. Neuro: A+Ox3. CN II-XII grossly intact. Moves all extremities spontaneously. Full sensation throughout. Normal gait.  Psych:  Responds to questions appropriately with a normal affect.   Assessment/Plan:  64 y.o. y/[place] here for CPE -1. Visit for preventive health examination  A. Screening Labs: Came in had fasting labs done. These are all stable/normal. However PSA was not included in his labs so I have talked to the lab today for them to add a PSA.  B. Screening For  Prostate Cancer: - PSA  C. Screening For Colorectal Cancer:  He has never had a screening colonoscopy. Today I discussed this with him and explained it to him. However, because of his work schedule and the fact that he does not have someone to drive him to and from the colonoscopy procedure he does not want [place] to schedule this right now. I told him that if he is able to plan days off of work and arrange for transportation for the procedure to call me and we can do the referral 90 any time.  At his physical 05/2014 I discussed Hemoccults but he deferred. At his physical 05/2015, discussed Hemoccults again and he is agreeable to do these at this time. At this time he states that he still cannot get colonoscopy done because of his work schedule.  D. Screening for Lung Cancer--Smoker At CPE 05/2014---Given his smoking history I recommended chest x-ray and he is agreeable. --Chest x-ray performed 05/30/2014. Showed emphysema. Showed possible  pulmonary nodule and recommended follow-up CT. --He did undergo chest CT 06/07/14. Showed changes consistent with COPD. No nodule corresponds with the abnormality seen on recent chest x-ray. Left nipple was noted in that region may have contributed to     , 10/2/2016the density seen on the prior exam.    D. Immunizations: Flu------- he received influenza vaccine 05/17/2014, 04/02/2015 Tetanus--- TdaP given here 05/30/14 Pneumococcal--- Given that he is a smoker, Pneumovax 23 --given here 05/30/2104.                           ---Will give Prevnar 13 at age 48.  Zostavax--I wrote down on his AVS for him to call his insurance and find out the cost of the Zostavax/shingles vaccine. If he is agreeable to pay this price than he is to call us and we will send order to his pharmacy for him to receive the vaccine there.    2. Seizures He just had lab to check phenytoin level. This remains low but he has continued to have no seizures.  3. Smoker I have given him a handout with tips for cessation at past visits. Can not prescribe medications to help with smoking cessation given his history of seizures and his seizure medication. He has continued to reduce his smoking and is down to less than half a pack a day. He had chest x-ray and follow-up CT November 2015 and December 2015--- see above  4. Pulmonary emphysema, unspecified emphysema type -He had chest x-ray to screen for cancer given his smoking. Follow-up CT. He is on Spiriva and he states that this has helped. As well he has reduced his smoking. Less than a half a pack a day.  5. Cataract He says that he has had cataract extraction and lens implants on both eyes performed. Says that he currently only sees ophthalmologist every 4 years,"when due for his DOT" Recommended annual follow-up with ophthalmologist.    Signed:   [address] [place], [place]  06/01/2015 8:13 AM

## 2015-06-02 LAB — PSA: PSA: 0.22 ng/mL (ref <=4.00)

## 2015-06-06 ENCOUNTER — Other Ambulatory Visit: Payer: Managed Care, Other (non HMO)

## 2015-06-06 DIAGNOSIS — Z0000 Encounter for general adult medical examination without abnormal findings: Secondary | ICD-10-CM

## 2015-06-07 LAB — FECAL OCCULT BLOOD, IMMUNOCHEMICAL: Fecal Occult Blood: NEGATIVE

## 2015-06-12 ENCOUNTER — Other Ambulatory Visit: Payer: Self-pay | Admitting: Physician Assistant

## 2015-06-12 NOTE — Telephone Encounter (Signed)
Medication refilled per protocol. 

## 2015-06-16 ENCOUNTER — Other Ambulatory Visit: Payer: Managed Care, Other (non HMO)

## 2015-06-16 DIAGNOSIS — Z0000 Encounter for general adult medical examination without abnormal findings: Secondary | ICD-10-CM

## 2015-06-16 LAB — FECAL OCCULT BLOOD, GUAIAC: FECAL OCCULT BLD: NEGATIVE

## 2015-06-17 LAB — FECAL OCCULT BLOOD, IMMUNOCHEMICAL
FECAL OCCULT BLOOD: NEGATIVE
Fecal Occult Blood: NEGATIVE

## 2015-06-20 ENCOUNTER — Encounter: Payer: Self-pay | Admitting: Family Medicine

## 2015-11-26 ENCOUNTER — Other Ambulatory Visit: Payer: Self-pay | Admitting: Physician Assistant

## 2015-11-28 NOTE — Telephone Encounter (Signed)
Medication refilled per protocol. 

## 2016-02-27 ENCOUNTER — Other Ambulatory Visit: Payer: Self-pay | Admitting: Family Medicine

## 2016-02-27 MED ORDER — TIOTROPIUM BROMIDE MONOHYDRATE 18 MCG IN CAPS: 18.0000 ug | ORAL_CAPSULE | Freq: Every day | RESPIRATORY_TRACT | 5 refills | Status: DC

## 2016-02-27 NOTE — Telephone Encounter (Signed)
Medication refilled per protocol. 

## 2016-03-06 ENCOUNTER — Other Ambulatory Visit: Payer: Self-pay | Admitting: Family Medicine

## 2016-03-06 MED ORDER — TIOTROPIUM BROMIDE MONOHYDRATE 18 MCG IN CAPS: 18.0000 ug | ORAL_CAPSULE | Freq: Every day | RESPIRATORY_TRACT | 0 refills | Status: DC

## 2016-03-06 NOTE — Telephone Encounter (Signed)
Medication refilled per protocol. 

## 2016-05-23 ENCOUNTER — Other Ambulatory Visit: Payer: Self-pay | Admitting: Physician Assistant

## 2016-05-27 NOTE — Telephone Encounter (Signed)
Rx filled per protocol/ pt need office visit letter sent

## 2016-06-10 ENCOUNTER — Ambulatory Visit (INDEPENDENT_AMBULATORY_CARE_PROVIDER_SITE_OTHER): Payer: Managed Care, Other (non HMO) | Admitting: Physician Assistant

## 2016-06-10 ENCOUNTER — Encounter: Payer: Self-pay | Admitting: Physician Assistant

## 2016-06-10 VITALS — BP 130/78 | HR 70 | Temp 97.8°F | Resp 18 | Wt 118.0 lb

## 2016-06-10 DIAGNOSIS — Z0000 Encounter for general adult medical examination without abnormal findings: Secondary | ICD-10-CM | POA: Diagnosis not present

## 2016-06-10 DIAGNOSIS — R569 Unspecified convulsions: Secondary | ICD-10-CM | POA: Diagnosis not present

## 2016-06-10 LAB — COMPLETE METABOLIC PANEL WITH GFR
ALBUMIN: 4.2 g/dL (ref 3.6–5.1)
ALK PHOS: 97 U/L (ref 40–115)
ALT: 16 U/L (ref 9–46)
AST: 22 U/L (ref 10–35)
BILIRUBIN TOTAL: 0.4 mg/dL (ref 0.2–1.2)
BUN: 14 mg/dL (ref 7–25)
CALCIUM: 9.4 mg/dL (ref 8.6–10.3)
CO2: 29 mmol/L (ref 20–31)
Chloride: 100 mmol/L (ref 98–110)
Creat: 0.72 mg/dL (ref 0.70–1.25)
GFR, Est African American: >89 mL/min (ref >=60)
GLUCOSE: 105 mg/dL — AB (ref 70–99)
POTASSIUM: 4.7 mmol/L (ref 3.5–5.3)
SODIUM: 138 mmol/L (ref 135–146)
TOTAL PROTEIN: 6.5 g/dL (ref 6.1–8.1)

## 2016-06-10 LAB — TSH: TSH: 1.03 m[IU]/L (ref 0.40–4.50)

## 2016-06-10 LAB — CBC WITH DIFFERENTIAL/PLATELET
BASOS ABS: 48 {cells}/uL (ref 0–200)
Basophils Relative: 1 %
EOS PCT: 3 %
Eosinophils Absolute: 144 cells/uL (ref 15–500)
HEMATOCRIT: 45.6 % (ref 38.5–50.0)
HEMOGLOBIN: 15.8 g/dL (ref 13.0–17.0)
LYMPHS ABS: 1200 {cells}/uL (ref 850–3900)
Lymphocytes Relative: 25 %
MCH: 35.1 pg — AB (ref 27.0–33.0)
MCHC: 34.6 g/dL (ref 32.0–36.0)
MCV: 101.3 fL — ABNORMAL HIGH (ref 80.0–100.0)
MONO ABS: 480 {cells}/uL (ref 200–950)
MPV: 10 fL (ref 7.5–12.5)
Monocytes Relative: 10 %
NEUTROS ABS: 2928 {cells}/uL (ref 1500–7800)
NEUTROS PCT: 61 %
Platelets: 233 10*3/uL (ref 140–400)
RBC: 4.5 MIL/uL (ref 4.20–5.80)
RDW: 13.2 % (ref 11.0–15.0)
WBC: 4.8 10*3/uL (ref 3.8–10.8)

## 2016-06-10 LAB — PSA: PSA: 0.2 ng/mL (ref <=4.0)

## 2016-06-10 NOTE — Progress Notes (Signed)
Patient ID: Talmadge Ganas MRN: [id number], DOB: [birth year] 65 y.o. Date of Encounter: 06/10/2016, 8:46 AM    Chief Complaint: Physical (CPE)  HPI: 65 y.o. y/o white male here for CPE.   Prior to his CPE 05/30/2014,  he had always been seen here just to follow-up regarding his seizures and to refill his medication for seizures.  I saw him 07/06/2012 to establish care here. Prior to that, he had been seeing Dr. Anne Hahn at [facility] secondary to history of seizures.  Patient had no PCP.      Dr. Anne Hahn told him he needed to have a PCP.   He also told him that the PCP could monitor him regarding the seizure medication.  He had had no seizure in well over 10 years, on Dilantin.  At his follow-up visits with me he has continued to report that he has continued to have no seizures. 06/10/2016 states that he has continued to have no seizures.  Also at his visits with me at each visit he has been decreasing the amount of cigarette smoking. 05/30/2014-- he says that he is down to one half pack a day.                      Says that he recently moved into a new house and he is not smoking in the house that that really limits the amount of smoking. 06/01/2015--says that he has continued to decrease the smoking and is smoking less than half a pack a day now. 06/21/2016--- says that he has continued to decrease smoking. However says that he smoking a little less than a half a pack a day.  He says that he did drink alcohol fairly heavily in the past but says that that was over 30 years ago that he was drinking heavily. Then tapered off of the other liquor etc. down to only beer and he has not even had any beer in the past year. 06/10/2016 states that he may drink 3 or 4 beers while watching the football game on the weekend. Because the only alcohol per week.  He does have a very thin stature but says that he has always been thin and has always been this weight.  He has no specific complaints  or concerns today. 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 Drives a box truck locally.   Review of Systems: Consitutional: No fever, chills, fatigue, night sweats, lymphadenopathy, or weight changes. Eyes: No visual changes, eye redness, or discharge. ENT/Mouth: Ears: No otalgia, tinnitus, hearing loss, discharge. Nose: No congestion, rhinorrhea, sinus pain, or epistaxis. Throat: No sore throat, post nasal drip, or teeth pain. Cardiovascular: No CP, palpitations, diaphoresis, DOE, edema, orthopnea, PND. Respiratory: No cough, hemoptysis, SOB, or wheezing. Gastrointestinal: No anorexia, dysphagia, reflux, pain, nausea, vomiting, hematemesis, diarrhea, constipation, BRBPR, or melena. Genitourinary: No dysuria, frequency, urgency, hematuria, incontinence, nocturia, decreased urinary stream, discharge, impotence, or testicular pain/masses. Musculoskeletal: No decreased ROM, myalgias, stiffness, joint swelling, or weakness. Skin: No rash, erythema, lesion changes, pain, warmth, jaundice, or pruritis. Neurological: No headache, dizziness, syncope, seizures, tremors, memory loss, coordination problems, or paresthesias. Psychological: No anxiety, depression, hallucinations, SI/HI. Endocrine: No fatigue, polydipsia, polyphagia, polyuria, or known diabetes. All other systems were reviewed and are otherwise negative.  Past Medical History:  Diagnosis Date  . Cataract   . COPD (chronic obstructive pulmonary disease) (HCC)   . Seizures (HCC)   . Smoker   . Smoker  Past Surgical History:  Procedure Laterality Date  . CATARACT EXTRACTION W/ INTRAOCULAR LENS  IMPLANT, BILATERAL Bilateral 07/02/2003, 07/01/2005   Dr. Stephannie LiBeavis--[place]    Home Meds:  Outpatient Medications Prior to Visit  Medication Sig Dispense Refill  . Multiple Vitamins-Minerals (MULTIVITAMIN PO) Take 1 tablet by mouth daily. Centrum silver    . phenytoin (DILANTIN) 100 MG ER capsule TAKE 3 CAPSULES (300 MG TOTAL) BY MOUTH  DAILY. 90 capsule 0  . tiotropium (SPIRIVA HANDIHALER) 18 MCG inhalation capsule Place 1 capsule (18 mcg total) into inhaler and inhale daily. 90 capsule 0   No facility-administered medications prior to visit.     Allergies: No Known Allergies  Social History   Social History  . Marital status: Widowed    Spouse name: N/A  . Number of children: N/A  . Years of education: N/A   Occupational History  . Not on file.   Social History Main Topics  . Smoking status: Current Some Day Smoker    Packs/day: 0.50    Years: 47.00  . Smokeless tobacco: Never Used  . Alcohol use No  . Drug use: No  . Sexual activity: Not on file   Other Topics Concern  . Not on file   Social History Narrative   Entered 05/2014:      Married.   Says he has no children with this wife.   Says he has a daughter who is almost 63 years old.   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   Usually works 6:30 AM to 6:30 PM at least 5 days a week.   Has DOT Physical every 4 years.      He says that he drank alcohol heavily about 30 years ago. Then tapered off of liquor and was only drinking beer. Has drank no beer or any type of alcohol in the past one year.      He has been decreasing his amount of smoking. To one half pack per day now.     Family History  Problem Relation Age of Onset  . Drug abuse Brother   . Alcohol abuse Brother     Physical Exam: Blood pressure 130/78, pulse 70, temperature 97.8 F (36.6 C), temperature source Oral, resp. rate 18, weight 118 lb (53.5 kg), SpO2 96 %.  General: Thin WM. Appears in no acute distress. HEENT: Normocephalic, atraumatic. Conjunctiva pink, sclera non-icteric. Pupils 2 mm constricting to 1 mm, round, regular, and equally reactive to light and accomodation. EOMI. Internal auditory canal clear. TMs with good cone of light and without pathology. Nasal mucosa pink. Nares are without discharge. No sinus tenderness. Oral mucosa pink. Pharynx without exudate.      Neck: Supple. Trachea midline. No thyromegaly. Full ROM. No lymphadenopathy.No carotid bruits.  Lungs: Breath sounds are distant/decreased throughout but clear. I hear no wheezes or rhonchi. Cardiovascular: RRR with S1 S2. No murmurs, rubs, or gallops. Distal pulses 2+ symmetrically. No carotid or abdominal bruits.2+ Bilateral Posterior Tibial Pulses. No LE edema.  Abdomen: Soft, non-tender, non-distended with normoactive bowel sounds. No hepatosplenomegaly or masses. No rebound/guarding. No CVA tenderness. No hernias. He has  protrusion of a right low rib. He says that this is secondary to a past rib fracture that healed this way. Rectal:Deferred Musculoskeletal: Full range of motion and 5/5 strength throughout. Without swelling. Skin: Warm and moist without erythema, ecchymosis, wounds, or rash. Neuro: A+Ox3. CN II-XII grossly intact. Moves all extremities spontaneously. Full sensation throughout. Normal gait.  Psych:  Responds  to questions appropriately with a normal affect.   Assessment/Plan:  65 y.o. y/o [place] male here for CPE -1. Visit for preventive health examination  A. Screening Labs: 06/10/2016: He is not fasting today. Reviewed that his lipid panel 06/2015 was excellent with HDL 93 and LDL 65. We can just simply skip his lipid panel today and go ahead and recheck all other screening labs now.  B. Screening For Prostate Cancer: - PSA  C. Screening For Colorectal Cancer:  He has never had a screening colonoscopy. Today I discussed this with him and explained it to him. However, because of his work schedule and the fact that he does not have someone to drive him to and from the colonoscopy procedure he does not want [place] to schedule this right now. I told him that if he is able to plan days off of work and arrange for transportation for the procedure to call me and we can do the referral 90 any time.  At his physical 05/2014 I discussed Hemoccults but he deferred. At his  physical 05/2015, discussed Hemoccults again and he is agreeable to do these at this time. At this time he states that he still cannot get colonoscopy done because of his work schedule. 05/2015 he did turn in all 3 Hemoccults and they were all 3 negative. 06/10/2016----again today states he cannot do colonoscopy because of the same issues above. Again today is agreeable to do Hemoccult 3.  D. Screening for Lung Cancer--Smoker At CPE 05/2014---Given his smoking history I recommended chest x-ray and he is agreeable. --Chest x-ray performed 05/30/2014. Showed emphysema. Showed possible pulmonary nodule and recommended follow-up CT. --He did undergo chest CT 06/07/14. Showed changes consistent with COPD. No nodule corresponds with the abnormality seen on recent chest x-ray. Left nipple was noted in that region may have contributed to     , 10/2/2016the density seen on the prior exam.    D. Immunizations: Flu------- he received influenza vaccine 05/17/2014, 04/02/2015, 03/2016 Tetanus--- TdaP given here 05/30/14 Pneumococcal--- Given that he is a smoker, Pneumovax 23 --given here 05/30/2104.                           ---Will give Prevnar 13 at age 12.  Zostavax-- 06/10/2016: I wrote down on his AVS for him to call his insurance and find out the cost of the Zostavax/shingles vaccine. If he is agreeable to pay this price than he is to call us and we will send order to his pharmacy for him to receive the vaccine there.    2. Seizures 06/10/2016: He continues to have no seizures. He continues to take his phenytoin daily as directed. Will recheck phenytoin level now.  3. Smoker I have given him a handout with tips for cessation at past visits. Can not prescribe medications to help with smoking cessation given his history of seizures and his seizure medication. He has continued to reduce his smoking and is down to less than half a pack a day. He had chest x-ray and follow-up CT November 2015 and  December 2015--- see above  4. Pulmonary emphysema, unspecified emphysema type -He had chest x-ray to screen for cancer given his smoking. Follow-up CT. He is on Spiriva and he states that this has helped. As well he has reduced his smoking. Less than a half a pack a day.  5. Cataract He says that he has had cataract extraction and lens implants on both eyes  performed. Says that he currently only sees ophthalmologist every 4 years,"when due for his DOT" Recommended annual follow-up with ophthalmologist.    Signed:   [address]Tylique Aull Beth GrahamDixon,[place], [place]  06/10/2016 8:46 AM

## 2016-06-11 LAB — PHENYTOIN LEVEL, TOTAL: PHENYTOIN LVL: 4.8 ug/mL — AB (ref 10.0–20.0)

## 2016-06-17 ENCOUNTER — Other Ambulatory Visit: Payer: Managed Care, Other (non HMO)

## 2016-06-17 DIAGNOSIS — Z0000 Encounter for general adult medical examination without abnormal findings: Secondary | ICD-10-CM

## 2016-06-17 NOTE — Addendum Note (Signed)
Addended by: Phineas SemenJOHNSON, TIFFANY A on: 06/17/2016 03:14 PM   Modules accepted: Orders

## 2016-06-18 LAB — FECAL OCCULT BLOOD, IMMUNOCHEMICAL
Fecal Occult Blood: NEGATIVE
Fecal Occult Blood: NEGATIVE
Fecal Occult Blood: POSITIVE — AB

## 2016-06-24 ENCOUNTER — Other Ambulatory Visit: Payer: Self-pay | Admitting: Physician Assistant

## 2016-06-27 NOTE — Telephone Encounter (Signed)
rx filled per protocol  

## 2016-07-11 IMAGING — CT CT CHEST W/O CM
2 of 3 series · 15 of 36 positions shown, 18 images · non-contrast
Comparison: 05/30/2014

CLINICAL DATA: Abnormal chest x-ray

EXAM:
CT CHEST WITHOUT CONTRAST
TECHNIQUE: Multidetector CT imaging of the chest was performed following the
standard protocol without IV contrast..

[Series 2: chestroutine 5.0 b40f · axial · 0.64mm/px · z∈[-408,-83]mm · 12 of 77 slices shown, 15 images]
[im 6/77  mediastinal]
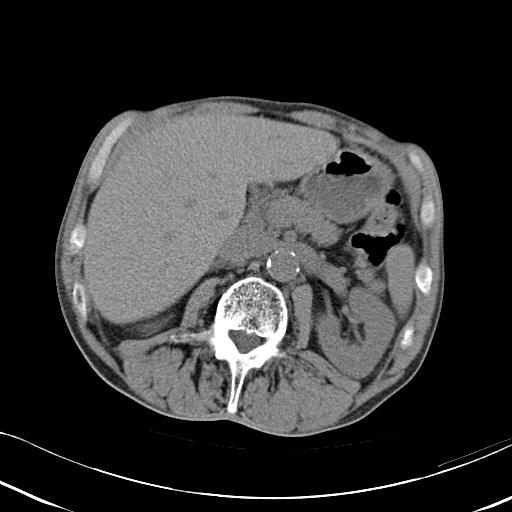
[im 6/77  lung]
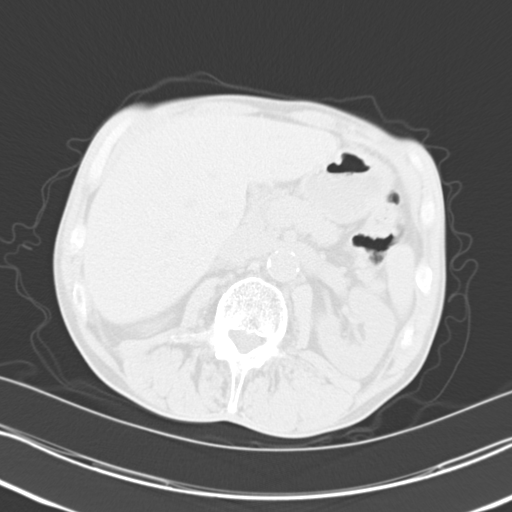
[im 12/77  lung]
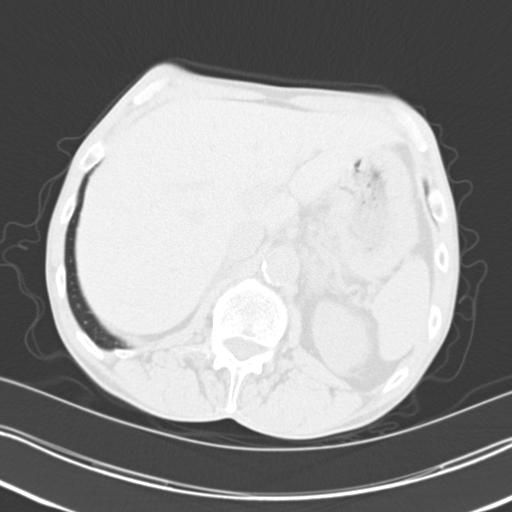
[im 17/77  lung]
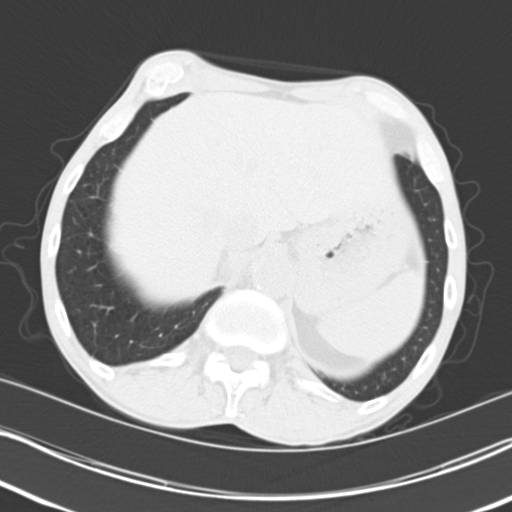
[im 23/77  lung]
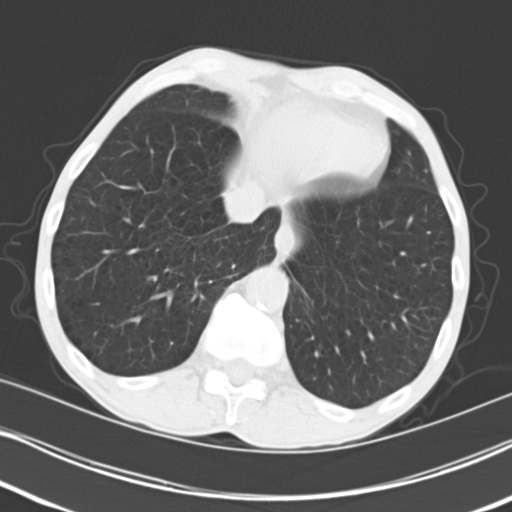
[im 29/77  mediastinal]
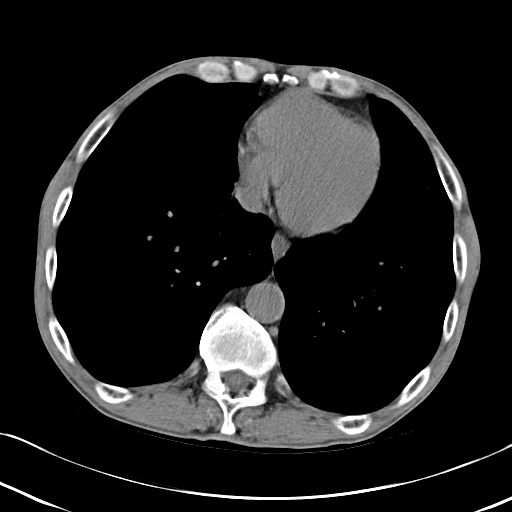
[im 29/77  lung]
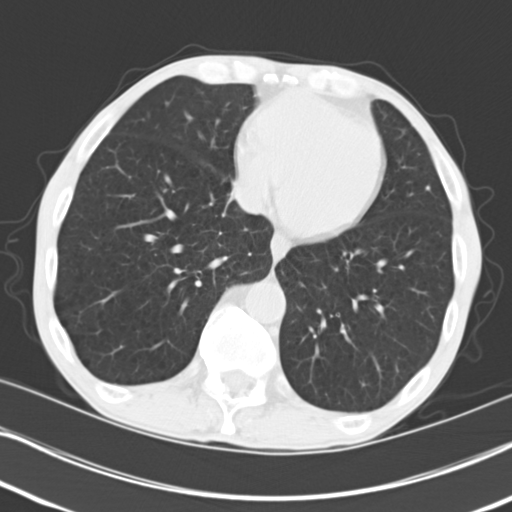
[im 34/77  lung]
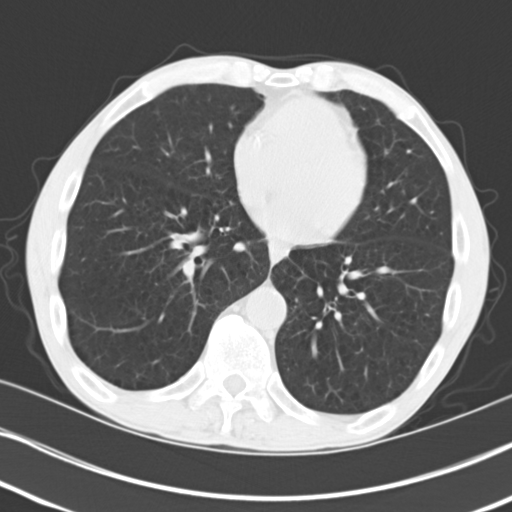
[im 43/77  lung]
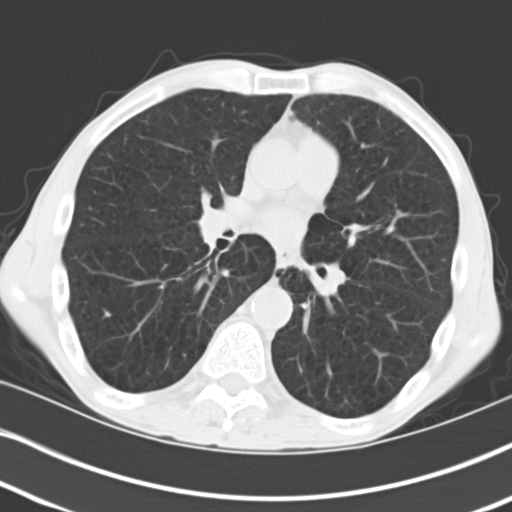
[im 48/77  lung]
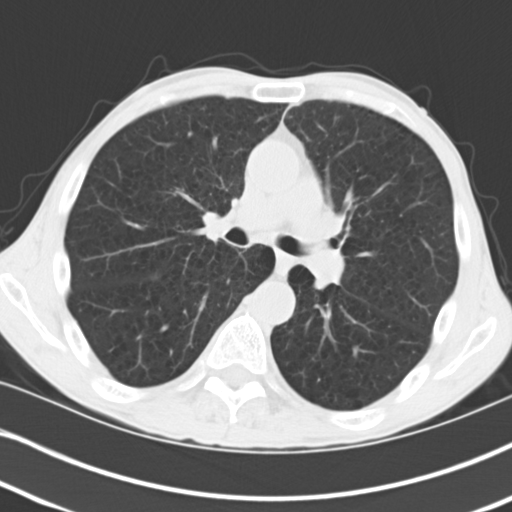
[im 54/77  mediastinal]
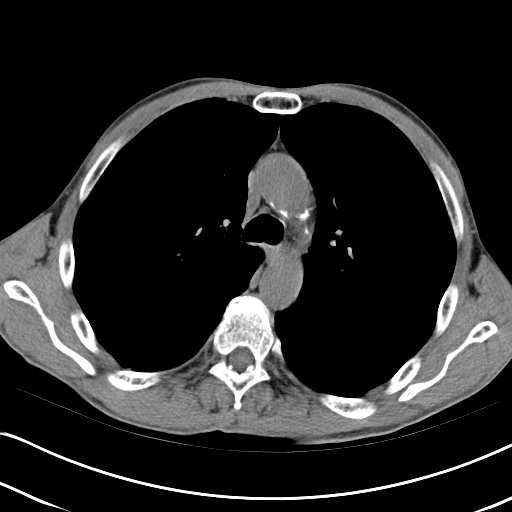
[im 54/77  lung]
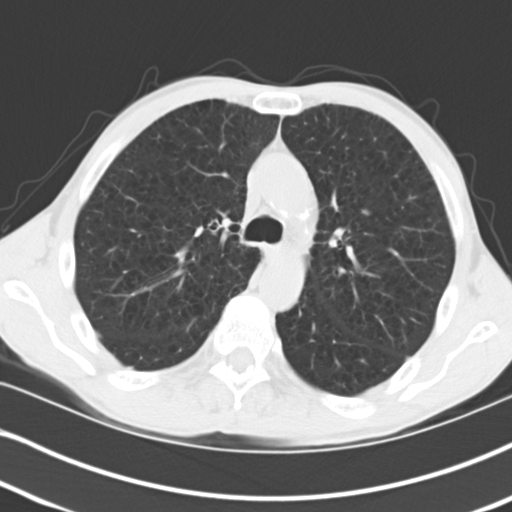
[im 60/77  lung]
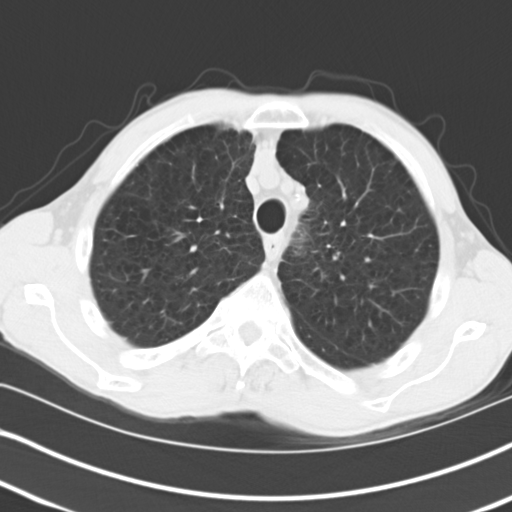
[im 65/77  lung]
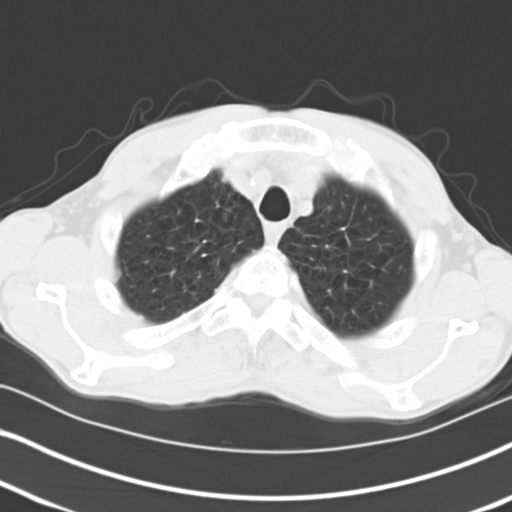
[im 71/77  lung]
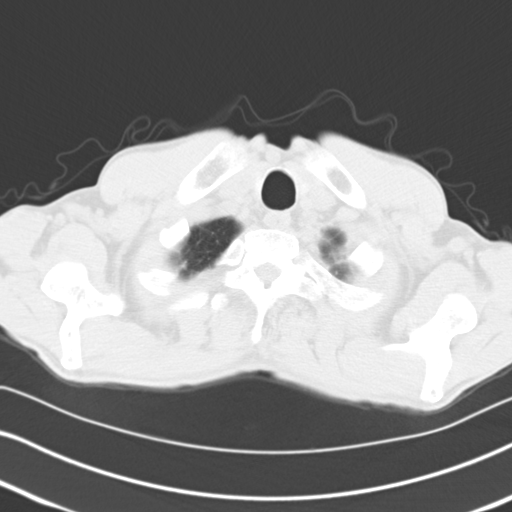

[Series 5: mpr coro 3mm · coronal · 0.62mm/px · 3 of 83 slices shown]
[im 17/83  lung]
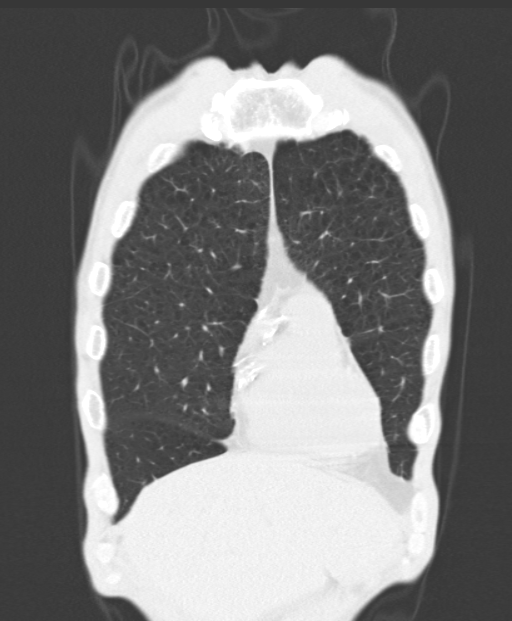
[im 33/83  lung]
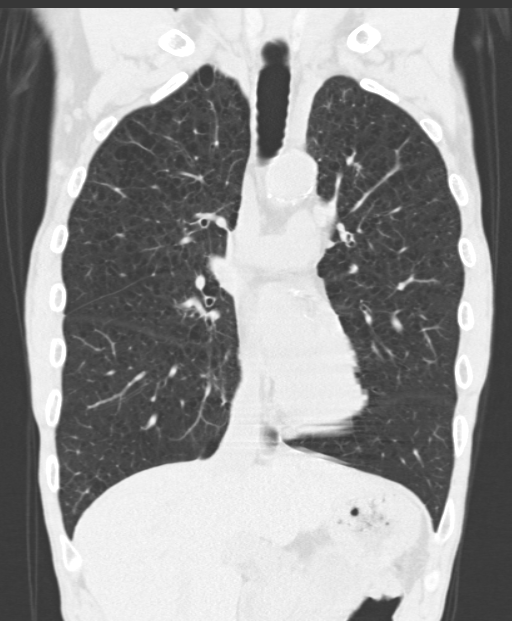
[im 50/83  lung]
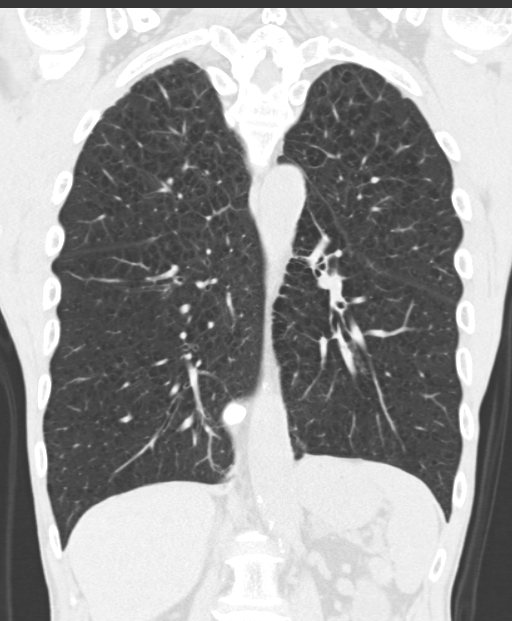

[15 of 36 positions shown; findings below may reference images not displayed]

FINDINGS: The lungs are again hyperinflated with diffuse emphysematous changes
no focal infiltrate or sizable effusion is seen. Some mild
thickening is noted along the lateral border of the major fissure on
the left. No definitive nodule is identified to correspond with the
density seen on the prior exam. The left nipple is noted at the
proximal level of the abnormality.

The thoracic inlet is within normal limits. Aortic calcifications
are seen without aneurysmal dilatation. Heavy coronary
calcifications are noted in the left anterior descending coronary
artery and right coronary artery. No hilar or mediastinal adenopathy
is seen. The visualized upper abdomen reveals no acute abnormality.
Degenerative changes of the thoracic spine are noted. No acute bony
abnormality is seen.
IMPRESSION: Changes consistent with COPD.

No nodule correspond with the abnormality seen on recent chest x-ray
is noted. The left nipple is noted in this region and may contribute
to density seen on the prior exam.

## 2016-10-20 ENCOUNTER — Other Ambulatory Visit: Payer: Self-pay | Admitting: Physician Assistant

## 2016-10-21 NOTE — Telephone Encounter (Signed)
Refill appropriate 

## 2016-11-19 ENCOUNTER — Other Ambulatory Visit: Payer: Self-pay | Admitting: Physician Assistant

## 2016-11-19 NOTE — Telephone Encounter (Signed)
Refill appropriate 

## 2016-11-21 ENCOUNTER — Telehealth: Payer: Self-pay

## 2016-11-21 MED ORDER — UMECLIDINIUM-VILANTEROL 62.5-25 MCG/INH IN AEPB: 1.0000 | INHALATION_SPRAY | Freq: Every day | RESPIRATORY_TRACT | 11 refills | Status: DC

## 2016-11-21 NOTE — Telephone Encounter (Signed)
Fax from pharmacy states Spiriva not covered . Insurance will cover anoro or incruse  Ok to to change to incruse?

## 2016-11-21 NOTE — Telephone Encounter (Signed)
Anoro 62.5/25 -- One inhalation once daily---# 1 + 11

## 2016-11-21 NOTE — Telephone Encounter (Signed)
Rx sent to pharmacy   

## 2017-02-16 ENCOUNTER — Other Ambulatory Visit: Payer: Self-pay | Admitting: Physician Assistant

## 2017-02-17 NOTE — Telephone Encounter (Signed)
Refill appropriate 

## 2017-04-21 ENCOUNTER — Encounter: Payer: Self-pay | Admitting: Physician Assistant

## 2017-04-21 ENCOUNTER — Ambulatory Visit (INDEPENDENT_AMBULATORY_CARE_PROVIDER_SITE_OTHER): Payer: Managed Care, Other (non HMO) | Admitting: Physician Assistant

## 2017-04-21 VITALS — BP 144/82 | HR 70 | Temp 97.6°F | Resp 16 | Ht 69.5 in | Wt 117.8 lb

## 2017-04-21 DIAGNOSIS — F17200 Nicotine dependence, unspecified, uncomplicated: Secondary | ICD-10-CM

## 2017-04-21 DIAGNOSIS — R569 Unspecified convulsions: Secondary | ICD-10-CM | POA: Diagnosis not present

## 2017-04-21 DIAGNOSIS — J439 Emphysema, unspecified: Secondary | ICD-10-CM | POA: Diagnosis not present

## 2017-04-21 DIAGNOSIS — Z0000 Encounter for general adult medical examination without abnormal findings: Secondary | ICD-10-CM

## 2017-04-21 DIAGNOSIS — Z114 Encounter for screening for human immunodeficiency virus [HIV]: Secondary | ICD-10-CM | POA: Diagnosis not present

## 2017-04-21 DIAGNOSIS — Z1159 Encounter for screening for other viral diseases: Secondary | ICD-10-CM

## 2017-04-21 NOTE — Progress Notes (Addendum)
Patient ID: Franklin Flores MRN: [id number], DOB: [birth year] 66 y.o. Date of Encounter: 04/21/2017, 9:55 AM    Chief Complaint: Physical (CPE)  HPI: 66 y.o. y/o male here for CPE.   Reviewed that he had a CPE with me 06/10/2016. He is aware that it has not been a full year since last CPE. Says he has new insurance and that's why he needs to come do physical. States that he will get a discount on his insurance rates if he comes in and does CPE now. He has no specific concerns to address today. Reports that he has had no new medical issues arise since last visit with me. Feels like everything regarding his health and medical condition is stable.  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 Says it is very physical work.  States that his smoking is staying at about one half pack per day. This has decreased from the past but is staying stable for the past year or so.  States that he drinks about 2 beers on the weekend while watching football. This is all of his alcohol intake now.  See my prior notes regarding past tobacco and alcohol use. In the past he smoked more. Also in the past he had told me that he had drank alcohol fairly heavily in the past but that was over 30 years ago that he was drinking heavily.  Also we have discussed at prior visits that he has a very thin stature but states that he has always been thin and has always been this weight.  Also the following is documented in prior visits. He first saw me to establish care here 07/06/2012. Prior to that he had been seeing Dr. Anne Hahn at [facility] neurology secondary to history of seizures. Dr. Anne Hahn told him he needed to have a PCP and his PCP could monitor him regarding his seizure medication. At that time he had had no seizure in well over 10 years, on Dilantin. Today he reports that he has  continued to have no seizures. He continues to take that Dilantin as directed.  Addendum added 05/14/2017.  Received Cologuard report.  Specimen collected 05/05/2017.  Positive.  Patient will be informed and will be informed to follow-up with GI.  Order for referral to GI will be put in.    Review of Systems: Consitutional: No fever, chills, fatigue, night sweats, lymphadenopathy, or weight changes. Eyes: No visual changes, eye redness, or discharge. ENT/Mouth: Ears: No otalgia, tinnitus, hearing loss, discharge. Nose: No congestion, rhinorrhea, sinus pain, or epistaxis. Throat: No sore throat, post nasal drip, or teeth pain. Cardiovascular: No CP, palpitations, diaphoresis, DOE, edema, orthopnea, PND. Respiratory: No  Hemoptysis. Gastrointestinal: No anorexia, dysphagia, reflux, pain, nausea, vomiting, hematemesis, diarrhea, constipation, BRBPR, or melena. Genitourinary: No dysuria, frequency, urgency, hematuria, incontinence, nocturia, decreased urinary stream, discharge, impotence, or testicular pain/masses. Musculoskeletal: No decreased ROM, myalgias, stiffness, joint swelling, or weakness. Skin: No rash, erythema, lesion changes, pain, warmth, jaundice, or pruritis. Neurological: No headache, dizziness, syncope, seizures, tremors, memory loss, coordination problems, or paresthesias. Psychological: No anxiety, depression, hallucinations, SI/HI. Endocrine: No fatigue, polydipsia, polyphagia, polyuria, or known diabetes. All other systems were reviewed and are otherwise negative.  Past Medical History:  Diagnosis Date  . Cataract   . COPD (chronic obstructive pulmonary disease) (HCC)   . Seizures (HCC)   .  Smoker   . Smoker      Past Surgical History:  Procedure Laterality Date  . CATARACT EXTRACTION W/ INTRAOCULAR LENS  IMPLANT, BILATERAL Bilateral 07/02/2003, 07/01/2005   Dr. Stephannie Li    Home Meds:  Outpatient Medications Prior to Visit  Medication Sig Dispense Refill    . Multiple Vitamins-Minerals (MULTIVITAMIN PO) Take 1 tablet by mouth daily. Centrum silver    . phenytoin (DILANTIN) 100 MG ER capsule TAKE 3 CAPSULES (300 MG TOTAL) BY MOUTH DAILY. 90 capsule 3  . umeclidinium-vilanterol (ANORO ELLIPTA) 62.5-25 MCG/INH AEPB Inhale 1 puff into the lungs daily. 1 each 11   No facility-administered medications prior to visit.     Allergies: No Known Allergies  Social History   Social History  . Marital status: Widowed    Spouse name: N/A  . Number of children: N/A  . Years of education: N/A   Occupational History  . Not on file.   Social History Main Topics  . Smoking status: Current Some Day Smoker    Packs/day: 0.50    Years: 47.00  . Smokeless tobacco: Never Used  . Alcohol use No  . Drug use: No  . Sexual activity: Not on file   Other Topics Concern  . Not on file   Social History Narrative   Entered 05/2014:      Married.   Says he has no children with this wife.   Says he has a daughter who is almost 51 years old.   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   Usually works 6:30 AM to 6:30 PM at least 5 days a week.   Has DOT Physical every 4 years.      He says that he drank alcohol heavily about 30 years ago. Then tapered off of liquor and was only drinking beer. Has drank no beer or any type of alcohol in the past one year.      He has been decreasing his amount of smoking. To one half pack per day now.     Family History  Problem Relation Age of Onset  . Drug abuse Brother   . Alcohol abuse Brother     Physical Exam: Blood pressure (!) 144/82, pulse 70, temperature 97.6 F (36.4 C), temperature source Oral, resp. rate 16, height 5' 9.5" (1.765 m), weight 53.4 kg (117 lb 12.8 oz), SpO2 98 %.  General: Thin WM. Appears in no acute distress. HEENT: Normocephalic, atraumatic. Conjunctiva pink, sclera non-icteric. Pupils 2 mm constricting to 1 mm, round, regular, and equally reactive to light and accomodation. EOMI.  Internal auditory canal clear. TMs with good cone of light and without pathology. Nasal mucosa pink. Nares are without discharge. No sinus tenderness. Oral mucosa pink. Neck: Supple. Trachea midline. No thyromegaly. Full ROM. No lymphadenopathy. No carotid bruit. Lungs: Clear to auscultation bilaterally without wheezes, rales, or rhonchi. Breathing is of normal effort and unlabored. Cardiovascular: RRR with S1 S2. No murmurs, rubs, or gallops. Distal pulses 2+ symmetrically. No carotid or abdominal bruits. Abdomen: Soft, non-tender, non-distended with normoactive bowel sounds. No hepatosplenomegaly or masses. No rebound/guarding. No CVA tenderness. No hernias. Musculoskeletal: Full range of motion and 5/5 strength throughout.  Skin: Warm and moist without erythema, ecchymosis, wounds, or rash. Neuro: A+Ox3. CN II-XII grossly intact. Moves all extremities spontaneously. Full sensation throughout. Normal gait. Psych:  Responds to questions appropriately with a normal affect.   Assessment/Plan:  66 y.o. y/o  male here for CPE  -1. Encounter for preventive health  examination A. Screening Labs: He is not fasting today but can/will return fasting tomorrow morning for labs. - HIV antibody; Future - Hepatitis C antibody; Future - CBC with Differential/Platelet; Future - COMPLETE METABOLIC PANEL WITH GFR; Future - Lipid panel; Future - TSH; Future - PSA; Future  B. Screening For Prostate Cancer: - PSA; Future  C. Screening For Colorectal Cancer:  He has never had a screening colonoscopy. Because of his work schedule and fact that he does not have someone to drive him to and from the colonoscopy procedure he does not want to have colonoscopy. In the past he has been doing Hemoccult cards. Today I discussed cologuard and he is agreeable so cologuard is given to him at visit today. Addendum added 05/14/2017.  Received Cologuard report.  Specimen collected 05/05/2017.  Positive.  Patient will be  informed and will be informed to follow-up with GI.  Order for referral to GI will be put in.  D. Immunizations: Flu-------- he received flu vaccine 2 weeks ago. Tetanus----T dap given here 05/30/2014 Pneumococcal--- given that he is a smoker Pneumovax 23 was given here 05/30/2014. Will give Prevnar 6413 at age [age]. Shingrix----today I wrote down on his AVS and reviewed with him for him to check with his insurance and find out their coverage and his cost and then let us know if he wants this.   2. Pulmonary emphysema, unspecified emphysema type (HCC) Given his seizure history cannot prescribed medication for smoking cessation. He has decreased smoking on his own down to one half pack per day.  3. Seizures (HCC) Reports that he is compliant with taking the phenytoin as directed. Will check lab level to monitor. - Phenytoin level, total; Future  4. Smoker He is aware of need to continue to decrease smoking and aware of its effects on his health.  5. Need for hepatitis C screening test - Hepatitis C antibody; Future  6. Screening for HIV (human immunodeficiency virus) - HIV antibody; Future    Signed:   [address] Galvin CourtMary Beth IthacaDixon,PA, [place]FM  04/21/2017 9:55 AM

## 2017-04-22 ENCOUNTER — Other Ambulatory Visit: Payer: Managed Care, Other (non HMO)

## 2017-04-22 ENCOUNTER — Other Ambulatory Visit: Payer: Self-pay

## 2017-04-22 DIAGNOSIS — Z0000 Encounter for general adult medical examination without abnormal findings: Secondary | ICD-10-CM

## 2017-04-22 DIAGNOSIS — R569 Unspecified convulsions: Secondary | ICD-10-CM

## 2017-04-22 DIAGNOSIS — Z1159 Encounter for screening for other viral diseases: Secondary | ICD-10-CM

## 2017-04-22 DIAGNOSIS — Z114 Encounter for screening for human immunodeficiency virus [HIV]: Secondary | ICD-10-CM

## 2017-04-23 LAB — COMPLETE METABOLIC PANEL WITH GFR
AG Ratio: 1.8 (calc) (ref 1.0–2.5)
ALBUMIN MSPROF: 4.2 g/dL (ref 3.6–5.1)
ALKALINE PHOSPHATASE (APISO): 128 U/L — AB (ref 40–115)
ALT: 12 U/L (ref 9–46)
AST: 18 U/L (ref 10–35)
BILIRUBIN TOTAL: 0.4 mg/dL (ref 0.2–1.2)
BUN: 12 mg/dL (ref 7–25)
CO2: 26 mmol/L (ref 20–32)
Calcium: 9.3 mg/dL (ref 8.6–10.3)
Chloride: 102 mmol/L (ref 98–110)
Creat: 0.81 mg/dL (ref 0.70–1.25)
GFR, EST AFRICAN AMERICAN: 109 mL/min/{1.73_m2} (ref >=60)
GFR, Est Non African American: 94 mL/min/{1.73_m2} (ref >=60)
GLOBULIN: 2.4 g/dL (ref 1.9–3.7)
GLUCOSE: 97 mg/dL (ref 65–99)
Potassium: 5.5 mmol/L — ABNORMAL HIGH (ref 3.5–5.3)
SODIUM: 139 mmol/L (ref 135–146)
Total Protein: 6.6 g/dL (ref 6.1–8.1)

## 2017-04-23 LAB — TSH: TSH: 1.47 m[IU]/L (ref 0.40–4.50)

## 2017-04-23 LAB — CBC WITH DIFFERENTIAL/PLATELET
Basophils Absolute: 110 cells/uL (ref 0–200)
Basophils Relative: 1.6 %
EOS PCT: 4.2 %
Eosinophils Absolute: 290 cells/uL (ref 15–500)
HEMATOCRIT: 44.9 % (ref 38.5–50.0)
Hemoglobin: 15.9 g/dL (ref 13.2–17.1)
LYMPHS ABS: 1366 {cells}/uL (ref 850–3900)
MCH: 34.9 pg — ABNORMAL HIGH (ref 27.0–33.0)
MCHC: 35.4 g/dL (ref 32.0–36.0)
MCV: 98.7 fL (ref 80.0–100.0)
MONOS PCT: 10.5 %
MPV: 10 fL (ref 7.5–12.5)
NEUTROS ABS: 4409 {cells}/uL (ref 1500–7800)
NEUTROS PCT: 63.9 %
Platelets: 227 10*3/uL (ref 140–400)
RBC: 4.55 10*6/uL (ref 4.20–5.80)
RDW: 12.1 % (ref 11.0–15.0)
Total Lymphocyte: 19.8 %
WBC mixed population: 725 cells/uL (ref 200–950)
WBC: 6.9 10*3/uL (ref 3.8–10.8)

## 2017-04-23 LAB — PHENYTOIN LEVEL, TOTAL: Phenytoin, Total: 3.6 mg/L — ABNORMAL LOW (ref 10.0–20.0)

## 2017-04-23 LAB — HEPATITIS C ANTIBODY
Hepatitis C Ab: NONREACTIVE
SIGNAL TO CUT-OFF: 0.01 (ref <1.00)

## 2017-04-23 LAB — LIPID PANEL
CHOL/HDL RATIO: 1.5 (calc) (ref <5.0)
Cholesterol: 179 mg/dL (ref <200)
HDL: 118 mg/dL (ref >40)
LDL CHOLESTEROL (CALC): 48 mg/dL
Non-HDL Cholesterol (Calc): 61 mg/dL (calc) (ref <130)
TRIGLYCERIDES: 51 mg/dL (ref <150)

## 2017-04-23 LAB — PSA: PSA: 0.2 ng/mL (ref <=4.0)

## 2017-04-23 LAB — HIV ANTIBODY (ROUTINE TESTING W REFLEX): HIV 1&2 Ab, 4th Generation: NONREACTIVE

## 2017-05-16 ENCOUNTER — Telehealth: Payer: Self-pay

## 2017-05-16 DIAGNOSIS — R195 Other fecal abnormalities: Secondary | ICD-10-CM

## 2017-05-16 NOTE — Telephone Encounter (Signed)
Called placed to patient in regards to his positive cologuard results lvmtrc/Referral placed for GI follow up

## 2017-05-20 ENCOUNTER — Encounter: Payer: Self-pay | Admitting: Internal Medicine

## 2017-05-20 NOTE — Telephone Encounter (Signed)
Spoke with patient he is aware of cologuard results as well as referral to GI

## 2017-06-13 ENCOUNTER — Other Ambulatory Visit: Payer: Self-pay | Admitting: Physician Assistant

## 2017-06-13 NOTE — Telephone Encounter (Signed)
Rx filled per protocol  

## 2017-07-11 ENCOUNTER — Other Ambulatory Visit: Payer: Self-pay

## 2017-07-11 ENCOUNTER — Encounter: Payer: Self-pay | Admitting: Gastroenterology

## 2017-07-11 ENCOUNTER — Ambulatory Visit (INDEPENDENT_AMBULATORY_CARE_PROVIDER_SITE_OTHER): Payer: Managed Care, Other (non HMO) | Admitting: Gastroenterology

## 2017-07-11 DIAGNOSIS — R195 Other fecal abnormalities: Secondary | ICD-10-CM

## 2017-07-11 MED ORDER — PEG 3350-KCL-NA BICARB-NACL 420 G PO SOLR: 4000.0000 mL | ORAL | 0 refills | Status: DC

## 2017-07-11 NOTE — Assessment & Plan Note (Addendum)
67 year old gentleman presenting for a positive Cologuard test.  Notably he also had heme positive stool back in December 2017 but declined colonoscopy at that time due to lack of transportation.  Lengthy discussion regarding requirement to have someone over the age of 65 legally responsible for him prior to the start of his procedure and to take responsibility of him upon discharge.  Patient voiced understanding.  States he has some family friends he can ask.  Clinically he is doing well.  Plan for colonoscopy in the near future.  I have discussed the risks, alternatives, benefits with regards to but not limited to the risk of reaction to medication, bleeding, infection, perforation and the patient is agreeable to proceed. Written consent to be obtained.

## 2017-07-11 NOTE — Patient Instructions (Signed)
1. Colonoscopy as scheduled. Please see separate instructions. 

## 2017-07-11 NOTE — Progress Notes (Signed)
Primary Care Physician:  Deon Pillingixon, Mary B, PA-C  Primary Gastroenterologist:  Roetta SessionsMichael Rourk, MD   Chief Complaint  Patient presents with  . positive colorectal cancer screening    HPI:  Franklin SayerDanny Flores is a 67 y.o. male here due to positive cologuard screening.  History of heme + stool 05/2016 but declined colonoscopy due to lack of transportation.  States he has a plan for transportation this time around, going to ask his neighbor or brother.  He is fully aware that he will need to have someone over age of 65 with him when he presents for colonoscopy to take responsibility of him at time of discharge.  He denies any constipation, diarrhea, melena, rectal bleeding.  No heartburn, dysphagia, anorexia, vomiting.  No weight loss.  Current Outpatient Medications  Medication Sig Dispense Refill  . Multiple Vitamins-Minerals (MULTIVITAMIN PO) Take 1 tablet by mouth daily. Centrum silver    . phenytoin (DILANTIN) 100 MG ER capsule TAKE 3 CAPSULES (300 MG TOTAL) BY MOUTH DAILY. 90 capsule 3  . umeclidinium-vilanterol (ANORO ELLIPTA) 62.5-25 MCG/INH AEPB Inhale 1 puff into the lungs daily. 1 each 11  . polyethylene glycol-electrolytes (TRILYTE) 420 g solution Take 4,000 mLs by mouth as directed. 4000 mL 0   No current facility-administered medications for this visit.     Allergies as of 07/11/2017  . (No Known Allergies)    Past Medical History:  Diagnosis Date  . Cataract   . COPD (chronic obstructive pulmonary disease) (HCC)   . Seizures (HCC)   . Smoker   . Smoker     Past Surgical History:  Procedure Laterality Date  . CATARACT EXTRACTION W/ INTRAOCULAR LENS  IMPLANT, BILATERAL Bilateral 07/02/2003, 07/01/2005   Dr. Stephannie LiBeavis--[place]    Family History  Problem Relation Age of Onset  . Drug abuse Brother   . Alcohol abuse Brother   . Colon cancer Neg Hx     Social History   Socioeconomic History  . Marital status: Widowed    Spouse name: Not on file  . Number of children: Not on  file  . Years of education: Not on file  . Highest education level: Not on file  Social Needs  . Financial resource strain: Not on file  . Food insecurity - worry: Not on file  . Food insecurity - inability: Not on file  . Transportation needs - medical: Not on file  . Transportation needs - non-medical: Not on file  Occupational History  . Not on file  Tobacco Use  . Smoking status: Current Some Day Smoker    Packs/day: 0.50    Years: 47.00    Pack years: 23.50  . Smokeless tobacco: Never Used  Substance and Sexual Activity  . Alcohol use: Yes    Comment: 1 or 2 (12 ounce) beers when watching football.  Drank regularly when he was in his 7620s but only occasionally the past 30 years.  . Drug use: No  . Sexual activity: Not on file  Other Topics Concern  . Not on file  Social History Narrative   Entered 05/2014:      Married.   Says he has no children with this wife.   Says he has a daughter who is almost 65 years old.   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   Usually works 6:30 AM to 6:30 PM at least 5 days a week.   Has DOT Physical every 4 years.      He says that he drank  alcohol heavily about 30 years ago. Then tapered off of liquor and was only drinking beer. Has drank no beer or any type of alcohol in the past one year.      He has been decreasing his amount of smoking. To one half pack per day now.       ROS:  General: Negative for anorexia, weight loss, fever, chills, fatigue, weakness. Eyes: Negative for vision changes.  ENT: Negative for hoarseness, difficulty swallowing , nasal congestion. CV: Negative for chest pain, angina, palpitations, dyspnea on exertion, peripheral edema.  Respiratory: Negative for dyspnea at rest, dyspnea on exertion, cough, sputum, wheezing.  GI: See history of present illness. GU:  Negative for dysuria, hematuria, urinary incontinence, urinary frequency, nocturnal urination.  MS: Negative for joint pain, low back pain.  Derm:  Negative for rash or itching.  Neuro: Negative for weakness, abnormal sensation, seizure, frequent headaches, memory loss, confusion.  Psych: Negative for anxiety, depression, suicidal ideation, hallucinations.  Endo: Negative for unusual weight change.  Heme: Negative for bruising or bleeding. Allergy: Negative for rash or hives.    Physical Examination:  BP 126/74   Pulse 84   Temp (!) 97.1 F (36.2 C) (Oral)   Ht 5\' 10"  (1.778 m)   Wt 124 lb (56.2 kg)   BMI 17.79 kg/m    General: Well-nourished, well-developed in no acute distress.  Head: Normocephalic, atraumatic.   Eyes: Conjunctiva pink, no icterus. Mouth: Oropharyngeal mucosa moist and pink , no lesions erythema or exudate. Neck: Supple without thyromegaly, masses, or lymphadenopathy.  Lungs: Clear to auscultation bilaterally.  Heart: Regular rate and rhythm, no murmurs rubs or gallops.  Abdomen: Bowel sounds are normal, nontender, nondistended, no hepatosplenomegaly or masses, no abdominal bruits or    hernia , no rebound or guarding.   Rectal: Deferred Extremities: No lower extremity edema. No clubbing or deformities.  Neuro: Alert and oriented x 4 , grossly normal neurologically.  Skin: Warm and dry, no rash or jaundice.   Psych: Alert and cooperative, normal mood and affect.  Labs: Lab Results  Component Value Date   WBC 6.9 04/22/2017   HGB 15.9 04/22/2017   HCT 44.9 04/22/2017   MCV 98.7 04/22/2017   PLT 227 04/22/2017   Lab Results  Component Value Date   CREATININE 0.81 04/22/2017   BUN 12 04/22/2017   NA 139 04/22/2017   K 5.5 (H) 04/22/2017   CL 102 04/22/2017   CO2 26 04/22/2017   Lab Results  Component Value Date   ALT 12 04/22/2017   AST 18 04/22/2017   ALKPHOS 128 04/22/2017   BILITOT 0.4 04/22/2017     Imaging Studies: No results found.

## 2017-07-14 NOTE — Progress Notes (Signed)
cc'ed to pcp °

## 2017-07-17 ENCOUNTER — Telehealth: Payer: Self-pay

## 2017-07-17 NOTE — Telephone Encounter (Signed)
Pt called office to cancel his colonoscopy that was scheduled for 07/23/17. States he can't pay $2500 out of pocket. He will try to get Medicare and supplement then have procedure. Called and informed Endo Scheduler.

## 2017-07-17 NOTE — Telephone Encounter (Signed)
Darlina RumpfMartina please let the patient know, there are many payment plans available and he does not have to pay that $2500 upfront.  We can bill him for that amount at a later date.

## 2017-07-17 NOTE — Telephone Encounter (Signed)
Please reach out to patient and remind him that he had a positive Cologuard test recently and last year was heme +. He may have precancerous polyp and cannot rule out colon cancer until colonoscopy is done. I would not recommend delaying procedure for long. Not sure if he can alter his insurance that quickly. ?make payment plan with [facility].   Any suggestions Durward MallardCamille?

## 2017-07-17 NOTE — Telephone Encounter (Signed)
Called and informed pt. He said he is going to start trying to change his insurance today and will be back in touch.

## 2017-07-23 ENCOUNTER — Encounter (HOSPITAL_COMMUNITY): Admission: RE | Payer: Self-pay | Source: Ambulatory Visit

## 2017-07-23 ENCOUNTER — Ambulatory Visit (HOSPITAL_COMMUNITY)
Admission: RE | Admit: 2017-07-23 | Payer: Managed Care, Other (non HMO) | Source: Ambulatory Visit | Admitting: Internal Medicine

## 2017-07-23 SURGERY — COLONOSCOPY
Anesthesia: Moderate Sedation

## 2017-10-13 ENCOUNTER — Other Ambulatory Visit: Payer: Self-pay | Admitting: Physician Assistant

## 2018-02-06 ENCOUNTER — Other Ambulatory Visit: Payer: Self-pay | Admitting: Physician Assistant

## 2018-04-24 DIAGNOSIS — Z23 Encounter for immunization: Secondary | ICD-10-CM | POA: Diagnosis not present

## 2018-06-08 ENCOUNTER — Other Ambulatory Visit: Payer: Self-pay | Admitting: *Deleted

## 2018-06-08 MED ORDER — PHENYTOIN SODIUM EXTENDED 100 MG PO CAPS: ORAL_CAPSULE | ORAL | 3 refills | Status: DC

## 2018-09-02 ENCOUNTER — Other Ambulatory Visit: Payer: Self-pay | Admitting: Family Medicine

## 2019-02-23 ENCOUNTER — Other Ambulatory Visit: Payer: Self-pay | Admitting: Family Medicine

## 2019-03-09 ENCOUNTER — Other Ambulatory Visit: Payer: Self-pay

## 2019-03-09 ENCOUNTER — Ambulatory Visit (INDEPENDENT_AMBULATORY_CARE_PROVIDER_SITE_OTHER): Payer: BC Managed Care – PPO | Admitting: Family Medicine

## 2019-03-09 ENCOUNTER — Encounter: Payer: Self-pay | Admitting: Family Medicine

## 2019-03-09 VITALS — BP 130/74 | HR 70 | Temp 97.9°F | Resp 14 | Ht 70.0 in | Wt 113.0 lb

## 2019-03-09 DIAGNOSIS — R195 Other fecal abnormalities: Secondary | ICD-10-CM

## 2019-03-09 DIAGNOSIS — Z1211 Encounter for screening for malignant neoplasm of colon: Secondary | ICD-10-CM

## 2019-03-09 DIAGNOSIS — Z0000 Encounter for general adult medical examination without abnormal findings: Secondary | ICD-10-CM

## 2019-03-09 DIAGNOSIS — Z0001 Encounter for general adult medical examination with abnormal findings: Secondary | ICD-10-CM

## 2019-03-09 DIAGNOSIS — R569 Unspecified convulsions: Secondary | ICD-10-CM | POA: Diagnosis not present

## 2019-03-09 DIAGNOSIS — J439 Emphysema, unspecified: Secondary | ICD-10-CM

## 2019-03-09 DIAGNOSIS — R634 Abnormal weight loss: Secondary | ICD-10-CM

## 2019-03-09 DIAGNOSIS — Z23 Encounter for immunization: Secondary | ICD-10-CM | POA: Diagnosis not present

## 2019-03-09 DIAGNOSIS — Z125 Encounter for screening for malignant neoplasm of prostate: Secondary | ICD-10-CM

## 2019-03-09 MED ORDER — PHENYTOIN SODIUM EXTENDED 100 MG PO CAPS: ORAL_CAPSULE | ORAL | 3 refills | Status: AC

## 2019-03-09 NOTE — Assessment & Plan Note (Signed)
He has not had any seizures in many years

## 2019-03-09 NOTE — Assessment & Plan Note (Signed)
Referral back to GI , with is weight loss this is concerning

## 2019-03-09 NOTE — Assessment & Plan Note (Signed)
COPD he has not had any problems

## 2019-03-09 NOTE — Patient Instructions (Addendum)
Referral for colonoscopy  Prevnar 13 given and flu shot  F/U 1 year for physical

## 2019-03-09 NOTE — Progress Notes (Signed)
Subjective:    Patient ID: Franklin Flores, male    DOB: 01/08/1951, 68 y.o.   MRN: [id number]  Patient presents for Follow-up (is fasting)   Pt here for physical examination his last visit was almost 2 years ago.   Seizure disorder has not had seizure in 18 years or more, no change in dose in many years on  300mg  ER due for labs    Was on [place] but has not had in 2 years, has not any any problems with cough or SOB   no difficulty with exertion ,. Does not want inhaler    He does drink on the weekend.   Immunizations due for Flu shot and Prevnar 13  He had positive Cologuard back in 2018 his weight is actually down 11 pounds since January 2019.  He states that he has a fairly good appetite he does take vitamins and also drinks Ensure.  Due for prostate cancer screening denies any difficulty with his urine stream.  He denies any blood in the urine or blood in the stools.   Meds and history reviewed     Review Of Systems:  GEN- denies fatigue, fever, +weight loss,weakness, recent illness HEENT- denies eye drainage, change in vision, nasal discharge, CVS- denies chest pain, palpitations RESP- denies SOB, cough, wheeze ABD- denies N/V, change in stools, abd pain GU- denies dysuria, hematuria, dribbling, incontinence MSK- denies joint pain, muscle aches, injury Neuro- denies headache, dizziness, syncope, seizure activity       Objective:    BP 130/74   Pulse 70   Temp 97.9 F (36.6 C) (Oral)   Resp 14   Ht 5\' 10"  (1.778 m)   Wt 113 lb (51.3 kg)   SpO2 99%   BMI 16.21 kg/m  GEN- NAD, alert and oriented x3 , thin male HEENT- PERRL, EOMI, non injected sclera, pink conjunctiva, MMM, oropharynx clear Neck- Supple, no thyromegaly CVS- RRR, no murmur RESP-CTAB ABD-NABS,soft,NT,ND EXT- No edema Pulses- Radial, DP- 2+  Fall/depression/audit C negative       Assessment & Plan:      Problem List Items Addressed This Visit      Unprioritized   COPD (chronic  obstructive pulmonary disease) ([place])    COPD he has not had any problems       Positive colorectal cancer screening using Cologuard test    Referral back to GI , with is weight loss this is concerning      Relevant Orders   Ambulatory referral to Gastroenterology   Seizures ([place])    He has not had any seizures in many years       Relevant Medications   phenytoin (DILANTIN) 100 MG ER capsule   Other Relevant Orders   Phenytoin level, total    Other Visit Diagnoses    Routine general medical examination at a health care facility    -  Primary   CPE done, fasting labs obtained, PSA screening    Relevant Orders   CBC with Differential/Platelet   Comprehensive metabolic panel   Lipid panel   Colon cancer screening       Relevant Orders   Ambulatory referral to Gastroenterology   Prostate cancer screening       Relevant Orders   PSA   Need for prophylactic vaccination against Streptococcus pneumoniae (pneumococcus)       Relevant Orders   Pneumococcal conjugate vaccine 13-valent IM (Completed)   Need for immunization against influenza  Relevant Orders   Flu Vaccine QUAD High Dose(Fluad) (Completed)   Abnormal weight loss          Note: This dictation was prepared with Dragon dictation along with smaller phrase technology. Any transcriptional errors that result from this process are unintentional.

## 2019-03-10 ENCOUNTER — Other Ambulatory Visit: Payer: Self-pay | Admitting: *Deleted

## 2019-03-10 DIAGNOSIS — E875 Hyperkalemia: Secondary | ICD-10-CM

## 2019-03-10 LAB — COMPREHENSIVE METABOLIC PANEL
AG Ratio: 1.8 (calc) (ref 1.0–2.5)
ALT: 15 U/L (ref 9–46)
AST: 22 U/L (ref 10–35)
Albumin: 4.4 g/dL (ref 3.6–5.1)
Alkaline phosphatase (APISO): 112 U/L (ref 35–144)
BUN: 18 mg/dL (ref 7–25)
CO2: 27 mmol/L (ref 20–32)
Calcium: 9.6 mg/dL (ref 8.6–10.3)
Chloride: 102 mmol/L (ref 98–110)
Creat: 0.86 mg/dL (ref 0.70–1.25)
Globulin: 2.4 g/dL (calc) (ref 1.9–3.7)
Glucose, Bld: 96 mg/dL (ref 65–99)
Potassium: 5.9 mmol/L — ABNORMAL HIGH (ref 3.5–5.3)
Sodium: 139 mmol/L (ref 135–146)
Total Bilirubin: 0.3 mg/dL (ref 0.2–1.2)
Total Protein: 6.8 g/dL (ref 6.1–8.1)

## 2019-03-10 LAB — CBC WITH DIFFERENTIAL/PLATELET
Absolute Monocytes: 714 cells/uL (ref 200–950)
Basophils Absolute: 98 cells/uL (ref 0–200)
Basophils Relative: 1.4 %
Eosinophils Absolute: 350 cells/uL (ref 15–500)
Eosinophils Relative: 5 %
HCT: 45.3 % (ref 38.5–50.0)
Hemoglobin: 15.9 g/dL (ref 13.2–17.1)
Lymphs Abs: 1561 cells/uL (ref 850–3900)
MCH: 35.5 pg — ABNORMAL HIGH (ref 27.0–33.0)
MCHC: 35.1 g/dL (ref 32.0–36.0)
MCV: 101.1 fL — ABNORMAL HIGH (ref 80.0–100.0)
MPV: 10.5 fL (ref 7.5–12.5)
Monocytes Relative: 10.2 %
Neutro Abs: 4277 cells/uL (ref 1500–7800)
Neutrophils Relative %: 61.1 %
Platelets: 240 10*3/uL (ref 140–400)
RBC: 4.48 10*6/uL (ref 4.20–5.80)
RDW: 12.1 % (ref 11.0–15.0)
Total Lymphocyte: 22.3 %
WBC: 7 10*3/uL (ref 3.8–10.8)

## 2019-03-10 LAB — LIPID PANEL
Cholesterol: 188 mg/dL (ref <200)
HDL: 91 mg/dL (ref >=40)
LDL Cholesterol (Calc): 74 mg/dL (calc)
Non-HDL Cholesterol (Calc): 97 mg/dL (calc) (ref <130)
Total CHOL/HDL Ratio: 2.1 (calc) (ref <5.0)
Triglycerides: 155 mg/dL — ABNORMAL HIGH (ref <150)

## 2019-03-10 LAB — PHENYTOIN LEVEL, TOTAL: Phenytoin, Total: 7.5 mg/L — ABNORMAL LOW (ref 10.0–20.0)

## 2019-03-10 LAB — PSA: PSA: 0.1 ng/mL (ref <=4.0)

## 2019-03-10 MED ORDER — SODIUM POLYSTYRENE SULFONATE 15 GM/60ML PO SUSP: 30.0000 g | Freq: Once | ORAL | 0 refills | Status: AC

## 2019-03-12 ENCOUNTER — Encounter: Payer: Self-pay | Admitting: Internal Medicine

## 2019-04-19 ENCOUNTER — Ambulatory Visit: Payer: Managed Care, Other (non HMO) | Admitting: Gastroenterology

## 2019-12-20 ENCOUNTER — Emergency Department (HOSPITAL_COMMUNITY): Admit: 2019-12-20 | Payer: Self-pay | Admitting: Interventional Cardiology

## 2019-12-20 ENCOUNTER — Encounter (HOSPITAL_COMMUNITY): Admission: EM | Disposition: E | Payer: Self-pay | Source: Home / Self Care | Attending: Internal Medicine

## 2019-12-20 ENCOUNTER — Emergency Department (HOSPITAL_COMMUNITY): Payer: Medicare Other

## 2019-12-20 ENCOUNTER — Other Ambulatory Visit: Payer: Self-pay

## 2019-12-20 ENCOUNTER — Inpatient Hospital Stay (HOSPITAL_COMMUNITY)
Admission: EM | Admit: 2019-12-20 | Discharge: 2019-12-30 | DRG: 270 | Disposition: E | Payer: Medicare Other | Attending: Internal Medicine | Admitting: Internal Medicine

## 2019-12-20 ENCOUNTER — Inpatient Hospital Stay (HOSPITAL_COMMUNITY): Payer: Medicare Other

## 2019-12-20 ENCOUNTER — Encounter (HOSPITAL_COMMUNITY): Payer: Self-pay

## 2019-12-20 DIAGNOSIS — G931 Anoxic brain damage, not elsewhere classified: Secondary | ICD-10-CM | POA: Diagnosis present

## 2019-12-20 DIAGNOSIS — Z20822 Contact with and (suspected) exposure to covid-19: Secondary | ICD-10-CM | POA: Diagnosis present

## 2019-12-20 DIAGNOSIS — I462 Cardiac arrest due to underlying cardiac condition: Secondary | ICD-10-CM | POA: Diagnosis present

## 2019-12-20 DIAGNOSIS — D62 Acute posthemorrhagic anemia: Secondary | ICD-10-CM | POA: Diagnosis not present

## 2019-12-20 DIAGNOSIS — I2119 ST elevation (STEMI) myocardial infarction involving other coronary artery of inferior wall: Secondary | ICD-10-CM

## 2019-12-20 DIAGNOSIS — E876 Hypokalemia: Secondary | ICD-10-CM | POA: Diagnosis present

## 2019-12-20 DIAGNOSIS — I472 Ventricular tachycardia: Secondary | ICD-10-CM | POA: Diagnosis present

## 2019-12-20 DIAGNOSIS — E872 Acidosis: Secondary | ICD-10-CM | POA: Diagnosis present

## 2019-12-20 DIAGNOSIS — R64 Cachexia: Secondary | ICD-10-CM | POA: Diagnosis present

## 2019-12-20 DIAGNOSIS — I2510 Atherosclerotic heart disease of native coronary artery without angina pectoris: Secondary | ICD-10-CM | POA: Diagnosis present

## 2019-12-20 DIAGNOSIS — I2111 ST elevation (STEMI) myocardial infarction involving right coronary artery: Secondary | ICD-10-CM | POA: Diagnosis present

## 2019-12-20 DIAGNOSIS — Z7289 Other problems related to lifestyle: Secondary | ICD-10-CM

## 2019-12-20 DIAGNOSIS — J449 Chronic obstructive pulmonary disease, unspecified: Secondary | ICD-10-CM | POA: Diagnosis present

## 2019-12-20 DIAGNOSIS — R570 Cardiogenic shock: Secondary | ICD-10-CM | POA: Diagnosis present

## 2019-12-20 DIAGNOSIS — Z681 Body mass index (BMI) 19 or less, adult: Secondary | ICD-10-CM | POA: Diagnosis not present

## 2019-12-20 DIAGNOSIS — N179 Acute kidney failure, unspecified: Secondary | ICD-10-CM | POA: Diagnosis present

## 2019-12-20 DIAGNOSIS — G40911 Epilepsy, unspecified, intractable, with status epilepticus: Secondary | ICD-10-CM | POA: Diagnosis present

## 2019-12-20 DIAGNOSIS — E8770 Fluid overload, unspecified: Secondary | ICD-10-CM | POA: Diagnosis not present

## 2019-12-20 DIAGNOSIS — Z66 Do not resuscitate: Secondary | ICD-10-CM | POA: Diagnosis not present

## 2019-12-20 DIAGNOSIS — Z09 Encounter for follow-up examination after completed treatment for conditions other than malignant neoplasm: Secondary | ICD-10-CM

## 2019-12-20 DIAGNOSIS — R54 Age-related physical debility: Secondary | ICD-10-CM | POA: Diagnosis present

## 2019-12-20 DIAGNOSIS — Z720 Tobacco use: Secondary | ICD-10-CM

## 2019-12-20 DIAGNOSIS — R739 Hyperglycemia, unspecified: Secondary | ICD-10-CM | POA: Diagnosis present

## 2019-12-20 DIAGNOSIS — F17210 Nicotine dependence, cigarettes, uncomplicated: Secondary | ICD-10-CM | POA: Diagnosis present

## 2019-12-20 DIAGNOSIS — I4901 Ventricular fibrillation: Secondary | ICD-10-CM

## 2019-12-20 DIAGNOSIS — J9601 Acute respiratory failure with hypoxia: Secondary | ICD-10-CM | POA: Diagnosis present

## 2019-12-20 DIAGNOSIS — I213 ST elevation (STEMI) myocardial infarction of unspecified site: Secondary | ICD-10-CM

## 2019-12-20 DIAGNOSIS — Z4509 Encounter for adjustment and management of other cardiac device: Secondary | ICD-10-CM

## 2019-12-20 DIAGNOSIS — Z515 Encounter for palliative care: Secondary | ICD-10-CM | POA: Diagnosis not present

## 2019-12-20 DIAGNOSIS — Z0189 Encounter for other specified special examinations: Secondary | ICD-10-CM

## 2019-12-20 DIAGNOSIS — Z79899 Other long term (current) drug therapy: Secondary | ICD-10-CM

## 2019-12-20 DIAGNOSIS — R634 Abnormal weight loss: Secondary | ICD-10-CM | POA: Diagnosis present

## 2019-12-20 DIAGNOSIS — I469 Cardiac arrest, cause unspecified: Secondary | ICD-10-CM | POA: Diagnosis present

## 2019-12-20 DIAGNOSIS — E871 Hypo-osmolality and hyponatremia: Secondary | ICD-10-CM | POA: Diagnosis present

## 2019-12-20 DIAGNOSIS — W1839XA Other fall on same level, initial encounter: Secondary | ICD-10-CM | POA: Diagnosis present

## 2019-12-20 HISTORY — PX: IABP INSERTION: CATH118242

## 2019-12-20 HISTORY — PX: RIGHT/LEFT HEART CATH AND CORONARY ANGIOGRAPHY: CATH118266

## 2019-12-20 HISTORY — PX: CORONARY/GRAFT ACUTE MI REVASCULARIZATION: CATH118305

## 2019-12-20 LAB — CBC WITH DIFFERENTIAL/PLATELET
Abs Immature Granulocytes: 0.49 10*3/uL — ABNORMAL HIGH (ref 0.00–0.07)
Basophils Absolute: 0.1 10*3/uL (ref 0.0–0.1)
Basophils Relative: 1 %
Eosinophils Absolute: 0.1 10*3/uL (ref 0.0–0.5)
Eosinophils Relative: 1 %
HCT: 39.1 % (ref 39.0–52.0)
Hemoglobin: 13.1 g/dL (ref 13.0–17.0)
Immature Granulocytes: 3 %
Lymphocytes Relative: 15 %
Lymphs Abs: 2.3 10*3/uL (ref 0.7–4.0)
MCH: 35.8 pg — ABNORMAL HIGH (ref 26.0–34.0)
MCHC: 33.5 g/dL (ref 30.0–36.0)
MCV: 106.8 fL — ABNORMAL HIGH (ref 80.0–100.0)
Monocytes Absolute: 0.9 10*3/uL (ref 0.1–1.0)
Monocytes Relative: 6 %
Neutro Abs: 11.3 10*3/uL — ABNORMAL HIGH (ref 1.7–7.7)
Neutrophils Relative %: 74 %
Platelets: 159 10*3/uL (ref 150–400)
RBC: 3.66 MIL/uL — ABNORMAL LOW (ref 4.22–5.81)
RDW: 12.5 % (ref 11.5–15.5)
WBC: 15.3 10*3/uL — ABNORMAL HIGH (ref 4.0–10.5)
nRBC: 0 % (ref 0.0–0.2)

## 2019-12-20 LAB — COMPREHENSIVE METABOLIC PANEL
ALT: 49 U/L — ABNORMAL HIGH (ref 0–44)
AST: 85 U/L — ABNORMAL HIGH (ref 15–41)
Albumin: 2.8 g/dL — ABNORMAL LOW (ref 3.5–5.0)
Alkaline Phosphatase: 102 U/L (ref 38–126)
Anion gap: 15 (ref 5–15)
BUN: 13 mg/dL (ref 8–23)
CO2: 15 mmol/L — ABNORMAL LOW (ref 22–32)
Calcium: 7.8 mg/dL — ABNORMAL LOW (ref 8.9–10.3)
Chloride: 103 mmol/L (ref 98–111)
Creatinine, Ser: 1.3 mg/dL — ABNORMAL HIGH (ref 0.61–1.24)
GFR calc Af Amer: >60 mL/min (ref >60)
GFR calc non Af Amer: 56 mL/min — ABNORMAL LOW (ref >60)
Glucose, Bld: 360 mg/dL — ABNORMAL HIGH (ref 70–99)
Potassium: 4.2 mmol/L (ref 3.5–5.1)
Sodium: 133 mmol/L — ABNORMAL LOW (ref 135–145)
Total Bilirubin: 0.3 mg/dL (ref 0.3–1.2)
Total Protein: 4.9 g/dL — ABNORMAL LOW (ref 6.5–8.1)

## 2019-12-20 LAB — PROTIME-INR
INR: 1.4 — ABNORMAL HIGH (ref 0.8–1.2)
Prothrombin Time: 16.3 seconds — ABNORMAL HIGH (ref 11.4–15.2)

## 2019-12-20 LAB — LIPID PANEL
Cholesterol: 136 mg/dL (ref 0–200)
HDL: 70 mg/dL (ref >40)
LDL Cholesterol: 37 mg/dL (ref 0–99)
Total CHOL/HDL Ratio: 1.9 RATIO
Triglycerides: 143 mg/dL (ref <150)
VLDL: 29 mg/dL (ref 0–40)

## 2019-12-20 LAB — APTT: aPTT: 41 seconds — ABNORMAL HIGH (ref 24–36)

## 2019-12-20 LAB — SARS CORONAVIRUS 2 BY RT PCR (HOSPITAL ORDER, PERFORMED IN ~~LOC~~ HOSPITAL LAB): SARS Coronavirus 2: NEGATIVE

## 2019-12-20 LAB — TROPONIN I (HIGH SENSITIVITY): Troponin I (High Sensitivity): 728 ng/L (ref <18)

## 2019-12-20 SURGERY — CORONARY/GRAFT ACUTE MI REVASCULARIZATION
Anesthesia: LOCAL

## 2019-12-20 MED ORDER — IOHEXOL 350 MG/ML SOLN
INTRAVENOUS | Status: AC
  Filled 2019-12-20: qty 1

## 2019-12-20 MED ORDER — ONDANSETRON HCL 4 MG/2ML IJ SOLN: 4.0000 mg | Freq: Four times a day (QID) | INTRAMUSCULAR | Status: DC | PRN

## 2019-12-20 MED ORDER — TICAGRELOR 90 MG PO TABS: 90.0000 mg | ORAL_TABLET | Freq: Two times a day (BID) | ORAL | Status: DC

## 2019-12-20 MED ORDER — AMIODARONE HCL IN DEXTROSE 360-4.14 MG/200ML-% IV SOLN
60.0000 mg/h | INTRAVENOUS | Status: AC
  Filled 2019-12-20: qty 200

## 2019-12-20 MED ORDER — HEPARIN SODIUM (PORCINE) 1000 UNIT/ML IJ SOLN
INTRAMUSCULAR | Status: DC | PRN
  Administered 2019-12-20: 4000 [IU] via INTRAVENOUS
  Administered 2019-12-20: 2000 [IU] via INTRAVENOUS

## 2019-12-20 MED ORDER — POLYETHYLENE GLYCOL 3350 17 G PO PACK: 17.0000 g | PACK | Freq: Every day | ORAL | Status: DC

## 2019-12-20 MED ORDER — AMIODARONE HCL IN DEXTROSE 360-4.14 MG/200ML-% IV SOLN
INTRAVENOUS | Status: AC
  Administered 2019-12-20: 150 mg via INTRAVENOUS
  Filled 2019-12-20: qty 200

## 2019-12-20 MED ORDER — ATROPINE SULFATE 1 MG/10ML IJ SOSY
PREFILLED_SYRINGE | INTRAMUSCULAR | Status: AC
  Filled 2019-12-20: qty 10

## 2019-12-20 MED ORDER — FENTANYL CITRATE (PF) 100 MCG/2ML IJ SOLN: 25.0000 ug | Freq: Once | INTRAMUSCULAR | Status: DC

## 2019-12-20 MED ORDER — SODIUM CHLORIDE 0.9 % IV SOLN: INTRAVENOUS | Status: DC

## 2019-12-20 MED ORDER — CANGRELOR BOLUS VIA INFUSION
INTRAVENOUS | Status: DC | PRN
  Administered 2019-12-20: 1560 ug via INTRAVENOUS

## 2019-12-20 MED ORDER — NITROGLYCERIN 1 MG/10 ML FOR IR/CATH LAB
INTRA_ARTERIAL | Status: AC
  Filled 2019-12-20: qty 10

## 2019-12-20 MED ORDER — TICAGRELOR 90 MG PO TABS
180.0000 mg | ORAL_TABLET | Freq: Once | ORAL | Status: AC
  Administered 2019-12-21: 180 mg
  Filled 2019-12-20: qty 2

## 2019-12-20 MED ORDER — IOHEXOL 350 MG/ML SOLN
INTRAVENOUS | Status: DC | PRN
  Administered 2019-12-20: 120 mL via INTRA_ARTERIAL

## 2019-12-20 MED ORDER — AMIODARONE HCL 150 MG/3ML IV SOLN
INTRAVENOUS | Status: AC
  Filled 2019-12-20: qty 6

## 2019-12-20 MED ORDER — SODIUM CHLORIDE 0.9% FLUSH: 3.0000 mL | INTRAVENOUS | Status: DC | PRN

## 2019-12-20 MED ORDER — MIDAZOLAM HCL 2 MG/2ML IJ SOLN
1.0000 mg | INTRAMUSCULAR | Status: AC | PRN
  Administered 2019-12-21 (×3): 1 mg via INTRAVENOUS
  Filled 2019-12-20 (×3): qty 2

## 2019-12-20 MED ORDER — HYDRALAZINE HCL 20 MG/ML IJ SOLN: 10.0000 mg | INTRAMUSCULAR | Status: AC | PRN

## 2019-12-20 MED ORDER — MAGNESIUM SULFATE 50 % IJ SOLN
INTRAMUSCULAR | Status: AC
  Filled 2019-12-20: qty 2

## 2019-12-20 MED ORDER — NOREPINEPHRINE 4 MG/250ML-% IV SOLN
INTRAVENOUS | Status: AC
  Filled 2019-12-20: qty 250

## 2019-12-20 MED ORDER — AMIODARONE HCL 150 MG/3ML IV SOLN
INTRAVENOUS | Status: DC | PRN
  Administered 2019-12-20 (×2): 300 mg via INTRAVENOUS

## 2019-12-20 MED ORDER — SODIUM CHLORIDE 0.9 % IV SOLN: INTRAVENOUS | Status: AC

## 2019-12-20 MED ORDER — PANTOPRAZOLE SODIUM 40 MG IV SOLR
40.0000 mg | Freq: Every day | INTRAVENOUS | Status: DC
  Administered 2019-12-21 – 2019-12-23 (×3): 40 mg via INTRAVENOUS
  Filled 2019-12-20 (×3): qty 40

## 2019-12-20 MED ORDER — AMIODARONE HCL 150 MG/3ML IV SOLN
INTRAVENOUS | Status: AC
  Filled 2019-12-20: qty 3

## 2019-12-20 MED ORDER — HEPARIN SODIUM (PORCINE) 5000 UNIT/ML IJ SOLN: 5000.0000 [IU] | Freq: Three times a day (TID) | INTRAMUSCULAR | Status: DC

## 2019-12-20 MED ORDER — NOREPINEPHRINE 4 MG/250ML-% IV SOLN: 0.0000 ug/min | INTRAVENOUS | Status: DC

## 2019-12-20 MED ORDER — VERAPAMIL HCL 2.5 MG/ML IV SOLN
INTRAVENOUS | Status: DC | PRN
  Administered 2019-12-20: 10 mL via INTRA_ARTERIAL

## 2019-12-20 MED ORDER — MAGNESIUM SULFATE 50 % IJ SOLN
INTRAMUSCULAR | Status: DC | PRN
  Administered 2019-12-20 (×2): 1 g via INTRAVENOUS

## 2019-12-20 MED ORDER — ASPIRIN 81 MG PO CHEW
81.0000 mg | CHEWABLE_TABLET | Freq: Every day | ORAL | Status: DC
  Administered 2019-12-21 – 2019-12-22 (×2): 81 mg via ORAL
  Filled 2019-12-20 (×2): qty 1

## 2019-12-20 MED ORDER — LIDOCAINE HCL (PF) 1 % IJ SOLN
INTRAMUSCULAR | Status: AC
  Filled 2019-12-20: qty 30

## 2019-12-20 MED ORDER — SODIUM CHLORIDE 0.9% FLUSH
3.0000 mL | Freq: Two times a day (BID) | INTRAVENOUS | Status: DC
  Administered 2019-12-21 – 2019-12-23 (×3): 3 mL via INTRAVENOUS

## 2019-12-20 MED ORDER — SODIUM CHLORIDE 0.9 % IV SOLN: 250.0000 mL | INTRAVENOUS | Status: DC | PRN

## 2019-12-20 MED ORDER — NOREPINEPHRINE 16 MG/250ML-% IV SOLN
0.0000 ug/min | INTRAVENOUS | Status: DC
  Administered 2019-12-20: 30 ug/min via INTRAVENOUS
  Administered 2019-12-21: 60 ug/min via INTRAVENOUS
  Administered 2019-12-21 – 2019-12-22 (×3): 50 ug/min via INTRAVENOUS
  Administered 2019-12-22: 42 ug/min via INTRAVENOUS
  Administered 2019-12-22: 54 ug/min via INTRAVENOUS
  Administered 2019-12-22: 53 ug/min via INTRAVENOUS
  Administered 2019-12-23: 60 ug/min via INTRAVENOUS
  Administered 2019-12-23: 50 ug/min via INTRAVENOUS
  Administered 2019-12-23: 57 ug/min via INTRAVENOUS
  Administered 2019-12-23: 60 ug/min via INTRAVENOUS
  Administered 2019-12-23: 65 ug/min via INTRAVENOUS
  Administered 2019-12-24: 42 ug/min via INTRAVENOUS
  Filled 2019-12-20: qty 250
  Filled 2019-12-20: qty 500
  Filled 2019-12-20 (×13): qty 250

## 2019-12-20 MED ORDER — LIDOCAINE HCL (PF) 1 % IJ SOLN
INTRAMUSCULAR | Status: DC | PRN
  Administered 2019-12-20: 2 mL
  Administered 2019-12-20: 10 mL

## 2019-12-20 MED ORDER — MIDAZOLAM HCL 2 MG/2ML IJ SOLN
INTRAMUSCULAR | Status: DC | PRN
  Administered 2019-12-20: 1 mg via INTRAVENOUS

## 2019-12-20 MED ORDER — ATROPINE SULFATE 1 MG/10ML IJ SOSY
PREFILLED_SYRINGE | INTRAMUSCULAR | Status: DC | PRN
  Administered 2019-12-20 (×2): 0.5 mg via INTRAVENOUS

## 2019-12-20 MED ORDER — SODIUM CHLORIDE 0.9 % IV SOLN
INTRAVENOUS | Status: AC | PRN
  Administered 2019-12-20: 999 mL/h via INTRAVENOUS

## 2019-12-20 MED ORDER — MIDAZOLAM HCL 2 MG/2ML IJ SOLN
INTRAMUSCULAR | Status: AC
  Filled 2019-12-20: qty 2

## 2019-12-20 MED ORDER — ACETAMINOPHEN 325 MG PO TABS: 650.0000 mg | ORAL_TABLET | ORAL | Status: DC | PRN

## 2019-12-20 MED ORDER — LABETALOL HCL 5 MG/ML IV SOLN: 10.0000 mg | INTRAVENOUS | Status: AC | PRN

## 2019-12-20 MED ORDER — NOREPINEPHRINE BITARTRATE 1 MG/ML IV SOLN
INTRAVENOUS | Status: AC | PRN
  Administered 2019-12-20: 5 ug/min via INTRAVENOUS

## 2019-12-20 MED ORDER — VERAPAMIL HCL 2.5 MG/ML IV SOLN
INTRAVENOUS | Status: AC
  Filled 2019-12-20: qty 2

## 2019-12-20 MED ORDER — HEPARIN (PORCINE) IN NACL 1000-0.9 UT/500ML-% IV SOLN
INTRAVENOUS | Status: DC | PRN
  Administered 2019-12-20 (×3): 500 mL

## 2019-12-20 MED ORDER — FENTANYL 2500MCG IN NS 250ML (10MCG/ML) PREMIX INFUSION
25.0000 ug/h | INTRAVENOUS | Status: DC
  Administered 2019-12-21: 100 ug/h via INTRAVENOUS
  Administered 2019-12-24: 25 ug/h via INTRAVENOUS
  Filled 2019-12-20: qty 250

## 2019-12-20 MED ORDER — CANGRELOR TETRASODIUM 50 MG IV SOLR
INTRAVENOUS | Status: AC
  Filled 2019-12-20: qty 50

## 2019-12-20 MED ORDER — AMIODARONE HCL IN DEXTROSE 360-4.14 MG/200ML-% IV SOLN
30.0000 mg/h | INTRAVENOUS | Status: DC
  Administered 2019-12-21 – 2019-12-24 (×6): 30 mg/h via INTRAVENOUS
  Filled 2019-12-20 (×8): qty 200

## 2019-12-20 MED ORDER — HEPARIN (PORCINE) IN NACL 1000-0.9 UT/500ML-% IV SOLN
INTRAVENOUS | Status: AC
  Filled 2019-12-20: qty 1000

## 2019-12-20 MED ORDER — MIDAZOLAM HCL 2 MG/2ML IJ SOLN: 1.0000 mg | INTRAMUSCULAR | Status: DC | PRN

## 2019-12-20 MED ORDER — SODIUM BICARBONATE 8.4 % IV SOLN
INTRAVENOUS | Status: DC | PRN
  Administered 2019-12-20 (×2): 50 meq via INTRAVENOUS

## 2019-12-20 MED ORDER — SODIUM CHLORIDE 0.9 % IV SOLN
INTRAVENOUS | Status: AC | PRN
  Administered 2019-12-20: 4 ug/kg/min via INTRAVENOUS

## 2019-12-20 MED ORDER — IPRATROPIUM-ALBUTEROL 0.5-2.5 (3) MG/3ML IN SOLN
3.0000 mL | Freq: Four times a day (QID) | RESPIRATORY_TRACT | Status: DC
  Administered 2019-12-21 (×2): 3 mL via RESPIRATORY_TRACT
  Filled 2019-12-20 (×2): qty 3

## 2019-12-20 MED ORDER — AMIODARONE LOAD VIA INFUSION
150.0000 mg | Freq: Once | INTRAVENOUS | Status: AC
  Filled 2019-12-20: qty 83.34

## 2019-12-20 MED ORDER — HEPARIN SODIUM (PORCINE) 5000 UNIT/ML IJ SOLN
4000.0000 [IU] | Freq: Once | INTRAMUSCULAR | Status: AC
  Administered 2019-12-20: 4000 [IU] via INTRAVENOUS
  Filled 2019-12-20: qty 1

## 2019-12-20 MED ORDER — FENTANYL BOLUS VIA INFUSION
25.0000 ug | INTRAVENOUS | Status: DC | PRN
  Filled 2019-12-20: qty 25

## 2019-12-20 MED ORDER — SODIUM CHLORIDE 0.9 % IV SOLN: 4.0000 ug/kg/min | INTRAVENOUS | Status: DC

## 2019-12-20 MED ORDER — DOCUSATE SODIUM 50 MG/5ML PO LIQD: 100.0000 mg | Freq: Two times a day (BID) | ORAL | Status: DC

## 2019-12-20 MED ORDER — HEPARIN SODIUM (PORCINE) 1000 UNIT/ML IJ SOLN
INTRAMUSCULAR | Status: AC
  Filled 2019-12-20: qty 1

## 2019-12-20 SURGICAL SUPPLY — 26 items
BALLN IABP SENSA PLUS 8F 50CC (BALLOONS) ×2
BALLN SAPPHIRE 2.0X12 (BALLOONS) ×2
BALLN SAPPHIRE 2.5X15 (BALLOONS) ×2
BALLN SAPPHIRE ~~LOC~~ 3.25X15 (BALLOONS) ×2 IMPLANT
BALLOON IABP SENS PLUS 8F 50CC (BALLOONS) ×1 IMPLANT
BALLOON SAPPHIRE 2.0X12 (BALLOONS) ×1 IMPLANT
BALLOON SAPPHIRE 2.5X15 (BALLOONS) ×1 IMPLANT
CATH 5FR JL3.5 JR4 ANG PIG MP (CATHETERS) ×2 IMPLANT
CATH EXTRAC PRONTO 5.5F 138CM (CATHETERS) ×2 IMPLANT
CATH LAUNCHER 6FR JR4 (CATHETERS) ×2 IMPLANT
CATH SWAN GANZ VIP 7.5F (CATHETERS) ×2 IMPLANT
GLIDESHEATH SLEND SS 6F .021 (SHEATH) ×2 IMPLANT
GUIDEWIRE INQWIRE 1.5J.035X260 (WIRE) ×1 IMPLANT
INQWIRE 1.5J .035X260CM (WIRE) ×2
KIT ENCORE 26 ADVANTAGE (KITS) ×2 IMPLANT
KIT HEART LEFT (KITS) ×2 IMPLANT
KIT HEMO VALVE WATCHDOG (MISCELLANEOUS) ×2 IMPLANT
PACK CARDIAC CATHETERIZATION (CUSTOM PROCEDURE TRAY) ×2 IMPLANT
SHEATH PINNACLE 6F 10CM (SHEATH) ×2 IMPLANT
SHEATH PINNACLE 8F 10CM (SHEATH) ×2 IMPLANT
SLEEVE REPOSITIONING LENGTH 30 (MISCELLANEOUS) ×2 IMPLANT
STENT RESOLUTE ONYX 3.0X22 (Permanent Stent) ×2 IMPLANT
TRANSDUCER W/STOPCOCK (MISCELLANEOUS) ×2 IMPLANT
TUBING CIL FLEX 10 FLL-RA (TUBING) ×2 IMPLANT
WIRE ASAHI PROWATER 180CM (WIRE) ×2 IMPLANT
WIRE EMERALD 3MM-J .035X150CM (WIRE) ×2 IMPLANT

## 2019-12-20 NOTE — ED Provider Notes (Signed)
[facility] EMERGENCY DEPARTMENT Provider Note   CSN: [id number] Arrival date & time:        History Chief Complaint  Patient presents with  . Post CPR  . Code STEMI    Franklin Flores is a 69 y.o. male.  Patient presenting with EMS after cardiac arrest who got 2 shocks for V. fib and one shock for V. tach, unresponsive in the field with intubation.  Patient had approximately 5 to 7 minutes of downtime between initial event and EMS arrival and CPR.  Patient unresponsive on arrival.  Airway appeared intact bilateral breath sounds  The history is limited by the condition of the patient.  Illness Location:  Cardiac Quality:  Arrest Severity:  Severe Onset quality:  Sudden Timing:  Constant Progression:  Partially resolved Chronicity:  New Context:  Patient witnessed cardiac arrest with 5-7 is a downtime requiring CPR 3 times shocks with return of spontaneous circulation Relieved by:  Intubation, cardioversion Worsened by:  Nothing Ineffective treatments:  None tried Associated symptoms: loss of consciousness and shortness of breath        Past Medical History:  Diagnosis Date  . Cataract   . COPD (chronic obstructive pulmonary disease) (HCC)   . Seizures (HCC)   . Smoker   . Smoker     Patient Active Problem List   Diagnosis Date Noted  . STEMI (ST elevation myocardial infarction) (HCC) 11/30/2019  . Cardiac arrest (HCC) 12/18/2019  . Positive colorectal cancer screening using Cologuard test 07/11/2017  . COPD (chronic obstructive pulmonary disease) (HCC)   . Seizures (HCC)   . Smoker   . Cataract     Past Surgical History:  Procedure Laterality Date  . CATARACT EXTRACTION W/ INTRAOCULAR LENS  IMPLANT, BILATERAL Bilateral 07/02/2003, 07/01/2005   Dr. Stephannie Li  . CORONARY/GRAFT ACUTE MI REVASCULARIZATION N/A 12/08/2019   Procedure: Coronary/Graft Acute MI Revascularization;  Surgeon: Corky Crafts, MD;  Location: [facility] INVASIVE CV  LAB;  Service: Cardiovascular;  Laterality: N/A;  . IABP INSERTION N/A 12/08/2019   Procedure: IABP Insertion;  Surgeon: Corky Crafts, MD;  Location: [facility] INVASIVE CV LAB;  Service: Cardiovascular;  Laterality: N/A;  . RIGHT/LEFT HEART CATH AND CORONARY ANGIOGRAPHY N/A 12/27/2019   Procedure: RIGHT/LEFT HEART CATH AND CORONARY ANGIOGRAPHY;  Surgeon: Corky Crafts, MD;  Location: [facility] INVASIVE CV LAB;  Service: Cardiovascular;  Laterality: N/A;       Family History  Problem Relation Age of Onset  . Drug abuse Brother   . Alcohol abuse Brother   . Colon cancer Neg Hx     Social History   Tobacco Use  . Smoking status: Current Some Day Smoker    Packs/day: 0.50    Years: 47.00    Pack years: 23.50  . Smokeless tobacco: Never Used  Vaping Use  . Vaping Use: Never used  Substance Use Topics  . Alcohol use: Yes    Comment: 1 or 2 (12 ounce) beers when watching football.  Drank regularly when he was in his 27s but only occasionally the past 30 years.  . Drug use: No    Home Medications Prior to Admission medications   Medication Sig Start Date End Date Taking? Authorizing Provider  phenytoin (DILANTIN) 100 MG ER capsule TAKE 3 CAPSULES (300 MG TOTAL) BY MOUTH DAILY. Patient taking differently: Take 300 mg by mouth daily.  03/09/19  Yes Garden, Velna Hatchet, MD    Allergies    Patient has no known allergies.  Review of Systems   Review of Systems  Unable to perform ROS: Intubated  Respiratory: Positive for shortness of breath.   Neurological: Positive for loss of consciousness.    Physical Exam Updated Vital Signs BP 115/76   Pulse 72   Temp (!) 95.9 F (35.5 C)   Resp 20   Ht 5\' 10"  (1.778 m)   Wt 53.2 kg   SpO2 100%   BMI 16.83 kg/m   Physical Exam Vitals and nursing note reviewed.  Constitutional:      General: He is in acute distress.     Appearance: He is well-developed. He is ill-appearing.  HENT:     Head: Normocephalic and atraumatic.     Left  Ear: External ear normal.     Nose: Nose normal.     Mouth/Throat:     Mouth: Mucous membranes are moist.     Pharynx: Oropharynx is clear.  Eyes:     Conjunctiva/sclera: Conjunctivae normal.     Pupils: Pupils are equal, round, and reactive to light.  Cardiovascular:     Rate and Rhythm: Normal rate and regular rhythm.     Pulses: Normal pulses.     Heart sounds: No murmur heard.   Pulmonary:     Effort: No respiratory distress.     Comments: Patient intubated and on ventilator Abdominal:     Palpations: Abdomen is soft.     Tenderness: There is no abdominal tenderness.  Musculoskeletal:     Cervical back: Neck supple.  Skin:    General: Skin is warm and dry.  Neurological:     Mental Status: He is unresponsive.     GCS: GCS eye subscore is 1. GCS verbal subscore is 1. GCS motor subscore is 1.     ED Results / Procedures / Treatments   Labs (all labs ordered are listed, but only abnormal results are displayed) Labs Reviewed  SARS CORONAVIRUS 2 BY RT PCR (HOSPITAL ORDER, [facility])  HEMOGLOBIN A1C  CBC WITH DIFFERENTIAL/PLATELET  PROTIME-INR  APTT  COMPREHENSIVE METABOLIC PANEL  LIPID PANEL  TROPONIN I (HIGH SENSITIVITY)    EKG EKG Interpretation  Date/Time:  Monday December 20 2019 20:47:22 EDT Ventricular Rate:  97 PR Interval:    QRS Duration: 154 QT Interval:  382 QTC Calculation: 486 R Axis:   95 Text Interpretation: Sinus rhythm Prolonged PR interval IVCD, consider atypical RBBB Anterolateral infarct, old ST elevation inferior leads with lateral depression Confirmed by Isla Pence [phone]) on 12/04/2019 8:59:38 PM Also confirmed by Isla Pence [phone]), editor [place], [place] [phone])  on 12/21/2019 9:19:19 AM    DG Chest Portable 1 View  Result Date: 12/22/2019 CLINICAL DATA:  Status post intubation. EXAM: PORTABLE CHEST 1 VIEW COMPARISON:  May 30, 2014. FINDINGS: The heart size and mediastinal contours are within  normal limits. Endotracheal tube is seen in grossly good position. No pneumothorax or pleural effusion is noted. Hyperexpansion of the lungs is noted. Mild interstitial densities are noted throughout both lungs which may represent scarring, but acute superimposed edema or inflammation cannot be excluded. The visualized skeletal structures are unremarkable. IMPRESSION: Endotracheal tube in grossly good position. Hyperexpansion of the lungs. Mild interstitial densities are noted throughout both lungs which may represent scarring, but acute superimposed edema or inflammation cannot be excluded. Electronically Signed   By: Marijo Conception M.D.   On: 12/01/2019 20:58   Procedures Procedures (including critical care time)  Medications Ordered in ED Medications  0.9 %  sodium chloride infusion (has no administration in time range)  amiodarone (NEXTERONE PREMIX) 360-4.14 MG/200ML-% (1.8 mg/mL) IV infusion (has no administration in time range)    ED Course  I have reviewed the triage vital signs and the nursing notes.  Pertinent labs & imaging results that were available during my care of the patient were reviewed by me and considered in my medical decision making (see chart for details).    MDM Rules/Calculators/A&P                          Patient is a 69 year old male presenting with EMS to the ED after witnessed cardiac arrest requiring 3 x defibrillation, field intubation.  On arrival endotracheal tube placement verified with video laryngoscopy, bilateral breath sounds, end-tidal CO2 and follow-up chest x-ray.  Cardiology at bedside, amiodarone drip ordered.  Standard STEMI labs were ordered and patient taken to the Cath Lab for definitive management admitted to the ICU.   Final Clinical Impression(s) / ED Diagnoses Final diagnoses:  Cardiac arrest [facility])    Rx / DC Orders ED Discharge Orders         Ordered    AMB Referral to Cardiac Rehabilitation - Phase II     Discontinue  Reprint      12/05/2019 2240           Janeece Fitting, MD 12/21/19 1328    Jacalyn Lefevre, MD 12/21/19 1645

## 2019-12-20 NOTE — Progress Notes (Signed)
Patient transported to cath lab without event.

## 2019-12-20 NOTE — H&P (Addendum)
Cardiology Admission History and Physical:   Patient ID: Franklin Flores; MRN: [id number]; DOB: July 01, 1951   Admission date: 2020-01-14  Primary Care Provider:  Salley Scarlet, MD Primary Cardiologist:  No primary care provider on file.   Chief Complaint:    Out-of-Hospital Cardiac Arrest  History of Present Illness:   ** History from EMS crew and by review of EMR  Franklin Flores is a 69 y.o. male with a history of COPD, tobacco use, seizure disorder and weight loss, who had a witnessed arrest at home. His girlfriend heard a thud and found the patient unresponsive. She called 911 and EMS arrived at the scene in less than 10 minutes (about 7 minutes of downtime without CPR). CPR was started and the initial rhythm was Torsades. Shocked x 1. Was also given IV magnesium and 3 doses of EPI. Had more VT/VF episodes. Was shocked 2 more times. ROSC achieved (initially wide complex rhythm then returning to NSR). Patient was intubated in the field. No paralytics or sedation given. He continued to be unresponsive.  ECG revealed ST elevations in II, III and aVF with ST depressions laterally. There were also Q waves in V1 and V2. CXR showed hyperinflated lungs (?COPD). Normal mediastinum. He received 150 mg of IV Amiodarone and 4000 units of heparin.  Taken emergently to the cardiac catheterization laboratory.   Past Medical History:  Diagnosis Date  . Cataract   . COPD (chronic obstructive pulmonary disease) (HCC)   . Seizures (HCC)   . Smoker   . Smoker     Past Surgical History:  Procedure Laterality Date  . CATARACT EXTRACTION W/ INTRAOCULAR LENS  IMPLANT, BILATERAL Bilateral 07/02/2003, 07/01/2005   Dr. Stephannie Li     Medications Prior to Admission: Prior to Admission medications   Medication Sig Start Date End Date Taking? Authorizing Provider  ENSURE PLUS (ENSURE PLUS) LIQD Take 237 mLs by mouth daily.    [provider]  Multiple Vitamins-Minerals (CENTRUM SILVER PO)  Take 1 tablet by mouth daily.    [provider]  phenytoin (DILANTIN) 100 MG ER capsule TAKE 3 CAPSULES (300 MG TOTAL) BY MOUTH DAILY. 03/09/19   Salley Scarlet, MD     Allergies:   No Known Allergies  Social History:   Social History   Socioeconomic History  . Marital status: Widowed    Spouse name: Not on file  . Number of children: Not on file  . Years of education: Not on file  . Highest education level: Not on file  Occupational History  . Not on file  Tobacco Use  . Smoking status: Current Some Day Smoker    Packs/day: 0.50    Years: 47.00    Pack years: 23.50  . Smokeless tobacco: Never Used  Vaping Use  . Vaping Use: Never used  Substance and Sexual Activity  . Alcohol use: Yes    Comment: 1 or 2 (12 ounce) beers when watching football.  Drank regularly when he was in his 101s but only occasionally the past 30 years.  . Drug use: No  . Sexual activity: Not Currently  Other Topics Concern  . Not on file  Social History Narrative   Entered 05/2014:      Married.   Says he has no children with this wife.   Says he has a daughter who is almost 44 years old.   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   Usually works 6:30 AM to 6:30 PM at least 5 days  a week.   Has DOT Physical every 4 years.      He says that he drank alcohol heavily about 30 years ago. Then tapered off of liquor and was only drinking beer. Has drank no beer or any type of alcohol in the past one year.      He has been decreasing his amount of smoking. To one half pack per day now.    Social Determinants of Health   Financial Resource Strain:   . Difficulty of Paying Living Expenses:   Food Insecurity:   . Worried About Charity fundraiser in the Last Year:   . Arboriculturist in the Last Year:   Transportation Needs:   . Film/video editor (Medical):   Marland Kitchen Lack of Transportation (Non-Medical):   Physical Activity:   . Days of Exercise per Week:   . Minutes of Exercise per  Session:   Stress:   . Feeling of Stress :   Social Connections:   . Frequency of Communication with Friends and Family:   . Frequency of Social Gatherings with Friends and Family:   . Attends Religious Services:   . Active Member of Clubs or Organizations:   . Attends Archivist Meetings:   Marland Kitchen Marital Status:   Intimate Partner Violence:   . Fear of Current or Ex-Partner:   . Emotionally Abused:   Marland Kitchen Physically Abused:   . Sexually Abused:      Family History:   The patient's family history includes Alcohol abuse in his brother; Drug abuse in his brother. There is no history of Colon cancer.     Review of Systems: Unable to obtain (patient intubated and unresponsive)   Physical Exam/Data:   Vitals:   12/12/2019 2056 12/16/2019 2057 12/15/2019 2058 12/23/2019 2059  BP:  113/84    Pulse:  96 96 94  Resp:  (!) 29 (!) 23 (!) 26  SpO2:  100% 100% 100%  Weight: 52 kg     Height: 5\' 10"  (1.778 m)      No intake or output data in the 24 hours ending 12/19/2019 2129 Filed Weights   12/23/2019 2056  Weight: 52 kg   Body mass index is 16.45 kg/m.  General: Cachectic and thin, intubated HEENT: Pupils sluggishly reactive (4 to 2) Lymph: no adenopathy Neck: no JVD Endocrine:  No thryomegaly Vascular: No carotid bruits; FA pulses 2+ bilaterally without bruits  Cardiac:  No murmurs Lungs:  clear to auscultation bilaterally, no wheezing, rhonchi or rales  Abd: soft, no hepatomegaly  Ext: no edema Musculoskeletal: Unable to assess Skin: warm and dry  Neuro:  Unable to assess Psych:  Unable to assess    Laboratory Data:  ChemistryNo results for input(s): NA, K, CL, CO2, GLUCOSE, BUN, CREATININE, CALCIUM, GFRNONAA, GFRAA, ANIONGAP in the last 168 hours.  No results for input(s): PROT, ALBUMIN, AST, ALT, ALKPHOS, BILITOT in the last 168 hours. Hematology Recent Labs  Lab 12/14/2019 2048  WBC 15.3*  RBC 3.66*  HGB 13.1  HCT 39.1  MCV 106.8*  MCH 35.8*  MCHC 33.5  RDW  12.5  PLT 159   Cardiac EnzymesNo results for input(s): TROPONINI in the last 168 hours. No results for input(s): TROPIPOC in the last 168 hours.  BNPNo results for input(s): BNP, PROBNP in the last 168 hours.  DDimer No results for input(s): DDIMER in the last 168 hours.  Radiology/Studies:  DG Chest Portable 1 View  Result Date: 12/29/2019 CLINICAL DATA:  Status post intubation. EXAM: PORTABLE CHEST 1 VIEW COMPARISON:  May 30, 2014. FINDINGS: The heart size and mediastinal contours are within normal limits. Endotracheal tube is seen in grossly good position. No pneumothorax or pleural effusion is noted. Hyperexpansion of the lungs is noted. Mild interstitial densities are noted throughout both lungs which may represent scarring, but acute superimposed edema or inflammation cannot be excluded. The visualized skeletal structures are unremarkable. IMPRESSION: Endotracheal tube in grossly good position. Hyperexpansion of the lungs. Mild interstitial densities are noted throughout both lungs which may represent scarring, but acute superimposed edema or inflammation cannot be excluded. Electronically Signed   By: Lupita Raider M.D.   On: 12/05/2019 20:58    Assessment and Plan:   1. Inferior ST elevation myocardial infarction  -Cycle cardiac enzymes -Serial ECGs -ASA down NGT or rectally -Unfractionated heparin IV -High dose statins -2-D echo to assess LV function -Emergent cardiac catheterization   2. Out-of-hospital cardiac arrest  -IV amiodarone (bolus + infusion) -Maintain K>4.0 and Mg >2.0 -Critical care consult -Consider targeted temperature management (TTM) -Vent management per PCCM   Severity of Illness: The appropriate patient status for this patient is INPATIENT. Inpatient status is judged to be reasonable and necessary in order to provide the required intensity of service to ensure the patient's safety. The patient's presenting symptoms, physical exam findings, and  initial radiographic and laboratory data in the context of their chronic comorbidities is felt to place them at high risk for further clinical deterioration. Furthermore, it is not anticipated that the patient will be medically stable for discharge from the hospital within 2 midnights of admission. The following factors support the patient status of inpatient.   " The patient's presenting symptoms include cardiac arrest. " The worrisome physical exam findings include unresponsiveness. " The initial radiographic and laboratory data are worrisome because of abnormal ECG. " The chronic co-morbidities include COPD.   * I certify that at the point of admission it is my clinical judgment that the patient will require inpatient hospital care spanning beyond 2 midnights from the point of admission due to high intensity of service, high risk for further deterioration and high frequency of surveillance required.*    For questions or updates, please contact [facility] Please consult [email or web address] for contact info under Cardiology/STEMI.    Signed, Lonie Peak, MD  12/12/2019 9:29 PM   I have examined the patient and reviewed assessment and plan and discussed with patient.  Agree with above as stated.    I personally reviewed the ECG and made the decision for him to come to cath lab.  He had successful PCI of RCA but due to hypotension, Right heart cath was done.  THis showed CI of 1.5.  Low LVEDP with RA pressure > LVEDP suggests RV failure.  Multiple runs of NSVT occurred and IV Amio was given.  During cath, CPR was done and two defibrillations were done as well.  AWith more Amio, rhythm stabilized.   He was transferred to CCU and I spoke with his daughter.  She reports that he had a colonscopy in the last year.  He was a heavy drinker in the past and is a heavy smoker currently.    Appreciate critical care involvement.  Case discussed with heart failure team as well, Dr. Gala Romney.     Lance Muss

## 2019-12-20 NOTE — Consult Note (Signed)
NAME:  Franklin Flores, MRN:  [id number], DOB:  1950-10-15, LOS: 0 ADMISSION DATE:  12/23/2019, CONSULTATION DATE: 12/16/2019 REFERRING MD: Eldridge Dace, CHIEF COMPLAINT: Status post cardiac arrest  Brief History   69 year old male who arrested at home  History of present illness   Patient is a 69 year old male with probable history of COPD seizure disorder for witnessed arrest at home.  No CPR was initiated until EMS arrived about 7 minutes after his initial arrest.  He was initially in torsades shocked received magnesium and epinephrine and round of being shocked twice more.  In the ER he was started on amiodarone found to have ST elevation in inferior leads with urgent cardiac catheterization performed. My evaluation at bedside the patient does have some spontaneous movement.  He is ventilated.  He is on Levophed 30 mics along with amiodarone, he is mechanically ventilated.  Balloon pump is in place.  Past Medical History   . Cataract   . COPD (chronic obstructive pulmonary disease) (HCC)   . Seizures (HCC)   . Smoker      Significant Hospital Events   Resuscitated emergently intubated 12/01/2019 Cardiac catheterization 12/06/2019  Consults:  PCCM  Procedures:  As above  Significant Diagnostic Tests:  Initial electrolytes are essentially normal slight elevation of LFTs  Micro Data:  NA  Antimicrobials:  NA  Interim history/subjective:  NA  Objective   Blood pressure (!) 122/93, pulse (!) 0, resp. rate (!) 0, height 5\' 10"  (1.778 m), weight 52 kg, SpO2 (!) 0 %.    Vent Mode: PRVC FiO2 (%):  [100 %] 100 % Set Rate:  [20 bmp] 20 bmp Vt Set:  [500 mL-580 mL] 580 mL PEEP:  [5 cmH20] 5 cmH20 Plateau Pressure:  [18 cmH20] 18 cmH20  No intake or output data in the 24 hours ending 12/08/2019 2332 Filed Weights   12/15/2019 2056  Weight: 52 kg    Examination: General: Thin white male intubated critically ill HENT: Intubated Lungs: Clear Cardiovascular: Regular sinus  rhythm Abdomen: Benign Extremities: Within normal limits Neuro: Nonfocal GU: Normal  Resolved Hospital Problem list   NA  Assessment & Plan:  1.  Status post cardiac arrest: Initial rhythm V. fib with prolonged time from initial arrest to CPR.  Will start cooling to 36 degrees.  Patient status post cardiac catheterization balloon pump in place with intervention per cardiology.  On amiodarone  2.  Respiratory failure: We will start continuous fentanyl.  Mechanical ventilation.  Repeat blood gas: Chest x-ray shows good tube placement otherwise clear  3.  History of seizure disorder  4.  Possible anoxic brain injury: Cooling protocol  5.  Hypotension: Support blood pressure with vasopressors  6.  Hyponatremia: Monitor for now  7.  Acute kidney injury: We will monitor for now   Best practice:  Diet: N.p.o. for now Pain/Anxiety/Delirium protocol (if indicated): We will start continuous fentanyl VAP protocol (if indicated): Yes DVT prophylaxis: Yes GI prophylaxis: Pepcid Glucose control: Monitor Mobility: Bedrest Code Status: Full Family Communication: Discussed case at length with patient's daughter Disposition: To ICU for further therapy  Labs   CBC: Recent Labs  Lab 12/19/2019 2048  WBC 15.3*  NEUTROABS 11.3*  HGB 13.1  HCT 39.1  MCV 106.8*  PLT 159    Basic Metabolic Panel: Recent Labs  Lab 11/30/2019 2048  NA 133*  K 4.2  CL 103  CO2 15*  GLUCOSE 360*  BUN 13  CREATININE 1.30*  CALCIUM 7.8*   GFR: Estimated Creatinine  Clearance: 40.6 mL/min (A) (by C-G formula based on SCr of 1.3 mg/dL (H)). Recent Labs  Lab January 19, 2020 2048  WBC 15.3*    Liver Function Tests: Recent Labs  Lab 2020-01-19 2048  AST 85*  ALT 49*  ALKPHOS 102  BILITOT 0.3  PROT 4.9*  ALBUMIN 2.8*   No results for input(s): LIPASE, AMYLASE in the last 168 hours. No results for input(s): AMMONIA in the last 168 hours.  ABG No results found for: PHART, PCO2ART, PO2ART, HCO3, TCO2,  ACIDBASEDEF, O2SAT   Coagulation Profile: Recent Labs  Lab 01-19-20 2048  INR 1.4*    Cardiac Enzymes: No results for input(s): CKTOTAL, CKMB, CKMBINDEX, TROPONINI in the last 168 hours.  HbA1C: No results found for: HGBA1C  CBG: No results for input(s): GLUCAP in the last 168 hours.  Review of Systems:   Unable to obtain but daughter says she spoke with him on the phone this morning he sounded good had no complaints  Past Medical History  He,  has a past medical history of Cataract, COPD (chronic obstructive pulmonary disease) ([place]), Seizures ([place]), Smoker, and Smoker.   Surgical History    Past Surgical History:  Procedure Laterality Date  . CATARACT EXTRACTION W/ INTRAOCULAR LENS  IMPLANT, BILATERAL Bilateral 07/02/2003, 07/01/2005   Dr. Carver Fila     Social History   reports that he has been smoking. He has a 23.50 pack-year smoking history. He has never used smokeless tobacco. He reports current alcohol use. He reports that he does not use drugs.   Family History   His family history includes Alcohol abuse in his brother; Drug abuse in his brother. There is no history of Colon cancer.   Allergies No Known Allergies   Home Medications  Prior to Admission medications   Medication Sig Start Date End Date Taking? Authorizing Provider  ENSURE PLUS (ENSURE PLUS) LIQD Take 237 mLs by mouth daily.    [provider]  Multiple Vitamins-Minerals (CENTRUM SILVER PO) Take 1 tablet by mouth daily.    [provider]  phenytoin (DILANTIN) 100 MG ER capsule TAKE 3 CAPSULES (300 MG TOTAL) BY MOUTH DAILY. 03/09/19   Alycia Rossetti, MD     Critical care time: 35 minutes spent bedside and chart review critical care planning, discussed with patient's daughter

## 2019-12-20 NOTE — Progress Notes (Signed)
Chaplain escorted patient's daughter from the ED to Heart waiting area and provided support during procedure. Will continue to be available. Rev. Lynnell Chad Pager [phone]

## 2019-12-20 NOTE — ED Triage Notes (Signed)
Pt BIB [facility] for eval of post arrest activated as Code STEMI after ROSC. Pt had witnessed arrest by wife, 7 minutes downtime w/ no interventions. On EMS arrival pt in vtach, shocked x3 (2 for vtach, 1 for torsades). After 3rd shock pt w/ ROSC to Vtach WITH pulse, then spontaneous conversion to NSR. At that time, pt activated as Code STEMI. Pt rec'd 2g Mag and 3 doses EPI w/ EMS. Pt intubated in the field w/ 7.0 ETT, equal blt breath sounds on arrival, confirmed by CXR on arrival.

## 2019-12-21 ENCOUNTER — Inpatient Hospital Stay (HOSPITAL_COMMUNITY): Payer: Medicare Other

## 2019-12-21 ENCOUNTER — Encounter (HOSPITAL_COMMUNITY): Payer: Self-pay | Admitting: Interventional Cardiology

## 2019-12-21 ENCOUNTER — Other Ambulatory Visit (HOSPITAL_COMMUNITY): Payer: Self-pay

## 2019-12-21 ENCOUNTER — Other Ambulatory Visit: Payer: Self-pay

## 2019-12-21 DIAGNOSIS — I469 Cardiac arrest, cause unspecified: Secondary | ICD-10-CM

## 2019-12-21 DIAGNOSIS — I2111 ST elevation (STEMI) myocardial infarction involving right coronary artery: Principal | ICD-10-CM

## 2019-12-21 DIAGNOSIS — G40901 Epilepsy, unspecified, not intractable, with status epilepticus: Secondary | ICD-10-CM

## 2019-12-21 LAB — BASIC METABOLIC PANEL
Anion gap: 12 (ref 5–15)
Anion gap: 15 (ref 5–15)
Anion gap: 14 (ref 5–15)
BUN: 17 mg/dL (ref 8–23)
BUN: 18 mg/dL (ref 8–23)
BUN: 19 mg/dL (ref 8–23)
CO2: 24 mmol/L (ref 22–32)
CO2: 20 mmol/L — ABNORMAL LOW (ref 22–32)
CO2: 17 mmol/L — ABNORMAL LOW (ref 22–32)
Calcium: 6.6 mg/dL — ABNORMAL LOW (ref 8.9–10.3)
Calcium: 7.1 mg/dL — ABNORMAL LOW (ref 8.9–10.3)
Calcium: 7.6 mg/dL — ABNORMAL LOW (ref 8.9–10.3)
Chloride: 106 mmol/L (ref 98–111)
Chloride: 107 mmol/L (ref 98–111)
Chloride: 112 mmol/L — ABNORMAL HIGH (ref 98–111)
Creatinine, Ser: 1.09 mg/dL (ref 0.61–1.24)
Creatinine, Ser: 1.32 mg/dL — ABNORMAL HIGH (ref 0.61–1.24)
Creatinine, Ser: 1.11 mg/dL (ref 0.61–1.24)
GFR calc Af Amer: >60 mL/min (ref >60)
GFR calc Af Amer: >60 mL/min (ref >60)
GFR calc Af Amer: >60 mL/min (ref >60)
GFR calc non Af Amer: >60 mL/min (ref >60)
GFR calc non Af Amer: 55 mL/min — ABNORMAL LOW (ref >60)
GFR calc non Af Amer: >60 mL/min (ref >60)
Glucose, Bld: 363 mg/dL — ABNORMAL HIGH (ref 70–99)
Glucose, Bld: 310 mg/dL — ABNORMAL HIGH (ref 70–99)
Glucose, Bld: 226 mg/dL — ABNORMAL HIGH (ref 70–99)
Potassium: 3.8 mmol/L (ref 3.5–5.1)
Potassium: 2.5 mmol/L — CL (ref 3.5–5.1)
Potassium: 3.7 mmol/L (ref 3.5–5.1)
Sodium: 142 mmol/L (ref 135–145)
Sodium: 142 mmol/L (ref 135–145)
Sodium: 143 mmol/L (ref 135–145)

## 2019-12-21 LAB — POCT I-STAT 7, (LYTES, BLD GAS, ICA,H+H)
Acid-base deficit: 7 mmol/L — ABNORMAL HIGH (ref 0.0–2.0)
Acid-base deficit: 8 mmol/L — ABNORMAL HIGH (ref 0.0–2.0)
Acid-base deficit: 9 mmol/L — ABNORMAL HIGH (ref 0.0–2.0)
Bicarbonate: 18 mmol/L — ABNORMAL LOW (ref 20.0–28.0)
Bicarbonate: 18.6 mmol/L — ABNORMAL LOW (ref 20.0–28.0)
Bicarbonate: 20 mmol/L (ref 20.0–28.0)
Calcium, Ion: 1.03 mmol/L — ABNORMAL LOW (ref 1.15–1.40)
Calcium, Ion: 1.05 mmol/L — ABNORMAL LOW (ref 1.15–1.40)
Calcium, Ion: 1.06 mmol/L — ABNORMAL LOW (ref 1.15–1.40)
HCT: 23 % — ABNORMAL LOW (ref 39.0–52.0)
HCT: 33 % — ABNORMAL LOW (ref 39.0–52.0)
HCT: 34 % — ABNORMAL LOW (ref 39.0–52.0)
Hemoglobin: 11.2 g/dL — ABNORMAL LOW (ref 13.0–17.0)
Hemoglobin: 11.6 g/dL — ABNORMAL LOW (ref 13.0–17.0)
Hemoglobin: 7.8 g/dL — ABNORMAL LOW (ref 13.0–17.0)
O2 Saturation: 100 %
O2 Saturation: 96 %
O2 Saturation: 99 %
Patient temperature: 34.3
Patient temperature: 34.8
Potassium: 2.9 mmol/L — ABNORMAL LOW (ref 3.5–5.1)
Potassium: 3.5 mmol/L (ref 3.5–5.1)
Potassium: 4 mmol/L (ref 3.5–5.1)
Sodium: 134 mmol/L — ABNORMAL LOW (ref 135–145)
Sodium: 139 mmol/L (ref 135–145)
Sodium: 142 mmol/L (ref 135–145)
TCO2: 19 mmol/L — ABNORMAL LOW (ref 22–32)
TCO2: 20 mmol/L — ABNORMAL LOW (ref 22–32)
TCO2: 21 mmol/L — ABNORMAL LOW (ref 22–32)
pCO2 arterial: 36.5 mmHg (ref 32.0–48.0)
pCO2 arterial: 36.5 mmHg (ref 32.0–48.0)
pCO2 arterial: 47.5 mmHg (ref 32.0–48.0)
pH, Arterial: 7.232 — ABNORMAL LOW (ref 7.350–7.450)
pH, Arterial: 7.288 — ABNORMAL LOW (ref 7.350–7.450)
pH, Arterial: 7.303 — ABNORMAL LOW (ref 7.350–7.450)
pO2, Arterial: 140 mmHg — ABNORMAL HIGH (ref 83.0–108.0)
pO2, Arterial: 465 mmHg — ABNORMAL HIGH (ref 83.0–108.0)
pO2, Arterial: 81 mmHg — ABNORMAL LOW (ref 83.0–108.0)

## 2019-12-21 LAB — GLUCOSE, CAPILLARY
Glucose-Capillary: 298 mg/dL — ABNORMAL HIGH (ref 70–99)
Glucose-Capillary: 340 mg/dL — ABNORMAL HIGH (ref 70–99)
Glucose-Capillary: 298 mg/dL — ABNORMAL HIGH (ref 70–99)
Glucose-Capillary: 230 mg/dL — ABNORMAL HIGH (ref 70–99)
Glucose-Capillary: 220 mg/dL — ABNORMAL HIGH (ref 70–99)
Glucose-Capillary: 209 mg/dL — ABNORMAL HIGH (ref 70–99)
Glucose-Capillary: 147 mg/dL — ABNORMAL HIGH (ref 70–99)

## 2019-12-21 LAB — LACTIC ACID, PLASMA
Lactic Acid, Venous: 7.1 mmol/L (ref 0.5–1.9)
Lactic Acid, Venous: 10.7 mmol/L (ref 0.5–1.9)

## 2019-12-21 LAB — MAGNESIUM
Magnesium: 2.8 mg/dL — ABNORMAL HIGH (ref 1.7–2.4)
Magnesium: 2.4 mg/dL (ref 1.7–2.4)

## 2019-12-21 LAB — POCT I-STAT EG7
Acid-base deficit: 8 mmol/L — ABNORMAL HIGH (ref 0.0–2.0)
Bicarbonate: 20.2 mmol/L (ref 20.0–28.0)
Calcium, Ion: 1.08 mmol/L — ABNORMAL LOW (ref 1.15–1.40)
HCT: 35 % — ABNORMAL LOW (ref 39.0–52.0)
Hemoglobin: 11.9 g/dL — ABNORMAL LOW (ref 13.0–17.0)
O2 Saturation: 51 %
Potassium: 3.9 mmol/L (ref 3.5–5.1)
Sodium: 135 mmol/L (ref 135–145)
TCO2: 22 mmol/L (ref 22–32)
pCO2, Ven: 53.9 mmHg (ref 44.0–60.0)
pH, Ven: 7.181 — CL (ref 7.250–7.430)
pO2, Ven: 34 mmHg (ref 32.0–45.0)

## 2019-12-21 LAB — TROPONIN I (HIGH SENSITIVITY): Troponin I (High Sensitivity): >27000 ng/L (ref <18)

## 2019-12-21 LAB — CBC
HCT: 29.2 % — ABNORMAL LOW (ref 39.0–52.0)
HCT: 24.3 % — ABNORMAL LOW (ref 39.0–52.0)
Hemoglobin: 9.9 g/dL — ABNORMAL LOW (ref 13.0–17.0)
Hemoglobin: 8.2 g/dL — ABNORMAL LOW (ref 13.0–17.0)
MCH: 35.5 pg — ABNORMAL HIGH (ref 26.0–34.0)
MCH: 35.8 pg — ABNORMAL HIGH (ref 26.0–34.0)
MCHC: 33.9 g/dL (ref 30.0–36.0)
MCHC: 33.7 g/dL (ref 30.0–36.0)
MCV: 104.7 fL — ABNORMAL HIGH (ref 80.0–100.0)
MCV: 106.1 fL — ABNORMAL HIGH (ref 80.0–100.0)
Platelets: 182 10*3/uL (ref 150–400)
Platelets: 140 10*3/uL — ABNORMAL LOW (ref 150–400)
RBC: 2.79 MIL/uL — ABNORMAL LOW (ref 4.22–5.81)
RBC: 2.29 MIL/uL — ABNORMAL LOW (ref 4.22–5.81)
RDW: 12.6 % (ref 11.5–15.5)
RDW: 12.9 % (ref 11.5–15.5)
WBC: 18.3 10*3/uL — ABNORMAL HIGH (ref 4.0–10.5)
WBC: 20.9 10*3/uL — ABNORMAL HIGH (ref 4.0–10.5)
nRBC: 0 % (ref 0.0–0.2)
nRBC: 0 % (ref 0.0–0.2)

## 2019-12-21 LAB — POCT I-STAT, CHEM 8
BUN: 17 mg/dL (ref 8–23)
Calcium, Ion: 1.15 mmol/L (ref 1.15–1.40)
Chloride: 101 mmol/L (ref 98–111)
Creatinine, Ser: 0.9 mg/dL (ref 0.61–1.24)
Glucose, Bld: 364 mg/dL — ABNORMAL HIGH (ref 70–99)
HCT: 38 % — ABNORMAL LOW (ref 39.0–52.0)
Hemoglobin: 12.9 g/dL — ABNORMAL LOW (ref 13.0–17.0)
Potassium: 4.1 mmol/L (ref 3.5–5.1)
Sodium: 134 mmol/L — ABNORMAL LOW (ref 135–145)
TCO2: 20 mmol/L — ABNORMAL LOW (ref 22–32)

## 2019-12-21 LAB — MRSA PCR SCREENING: MRSA by PCR: NEGATIVE

## 2019-12-21 LAB — COOXEMETRY PANEL
Carboxyhemoglobin: 1.8 % — ABNORMAL HIGH (ref 0.5–1.5)
Methemoglobin: 1.3 % (ref 0.0–1.5)
O2 Saturation: 32.3 %
Total hemoglobin: 10.5 g/dL — ABNORMAL LOW (ref 12.0–16.0)

## 2019-12-21 LAB — POCT ACTIVATED CLOTTING TIME
Activated Clotting Time: 290 seconds
Activated Clotting Time: 312 seconds

## 2019-12-21 LAB — PHENYTOIN LEVEL, TOTAL: Phenytoin Lvl: 4 ug/mL — ABNORMAL LOW (ref 10.0–20.0)

## 2019-12-21 LAB — ECHOCARDIOGRAM COMPLETE
Height: 70 in
Weight: 1876.56 oz

## 2019-12-21 LAB — TRIGLYCERIDES: Triglycerides: 77 mg/dL (ref <150)

## 2019-12-21 LAB — PHOSPHORUS: Phosphorus: 2.6 mg/dL (ref 2.5–4.6)

## 2019-12-21 LAB — HEPARIN LEVEL (UNFRACTIONATED): Heparin Unfractionated: 0.57 IU/mL (ref 0.30–0.70)

## 2019-12-21 LAB — HIV ANTIBODY (ROUTINE TESTING W REFLEX): HIV Screen 4th Generation wRfx: NONREACTIVE

## 2019-12-21 MED ORDER — SODIUM CHLORIDE 0.9% FLUSH
10.0000 mL | Freq: Two times a day (BID) | INTRAVENOUS | Status: DC
  Administered 2019-12-21: 10 mL
  Administered 2019-12-23: 20 mL

## 2019-12-21 MED ORDER — MILRINONE LACTATE IN DEXTROSE 20-5 MG/100ML-% IV SOLN
0.2500 ug/kg/min | INTRAVENOUS | Status: DC
  Administered 2019-12-21 – 2019-12-23 (×4): 0.25 ug/kg/min via INTRAVENOUS
  Filled 2019-12-21 (×3): qty 100

## 2019-12-21 MED ORDER — CLOPIDOGREL BISULFATE 300 MG PO TABS
300.0000 mg | ORAL_TABLET | Freq: Once | ORAL | Status: AC
  Administered 2019-12-21: 300 mg via ORAL
  Filled 2019-12-21: qty 1

## 2019-12-21 MED ORDER — IPRATROPIUM-ALBUTEROL 0.5-2.5 (3) MG/3ML IN SOLN: 3.0000 mL | Freq: Four times a day (QID) | RESPIRATORY_TRACT | Status: DC | PRN

## 2019-12-21 MED ORDER — INSULIN DETEMIR 100 UNIT/ML ~~LOC~~ SOLN
10.0000 [IU] | Freq: Two times a day (BID) | SUBCUTANEOUS | Status: DC
  Administered 2019-12-21 – 2019-12-23 (×4): 10 [IU] via SUBCUTANEOUS
  Filled 2019-12-21 (×8): qty 0.1

## 2019-12-21 MED ORDER — ALBUMIN HUMAN 5 % IV SOLN: 25.0000 g | Freq: Once | INTRAVENOUS | Status: DC

## 2019-12-21 MED ORDER — LACTATED RINGERS IV BOLUS
1000.0000 mL | Freq: Once | INTRAVENOUS | Status: AC
  Administered 2019-12-21: 1000 mL via INTRAVENOUS

## 2019-12-21 MED ORDER — SODIUM CHLORIDE 0.9 % IV SOLN
1.0000 g | Freq: Once | INTRAVENOUS | Status: AC
  Administered 2019-12-21: 1 g via INTRAVENOUS
  Filled 2019-12-21: qty 10

## 2019-12-21 MED ORDER — PHENYTOIN 125 MG/5ML PO SUSP
100.0000 mg | Freq: Three times a day (TID) | ORAL | Status: DC
  Administered 2019-12-21 – 2019-12-23 (×9): 100 mg
  Filled 2019-12-21 (×10): qty 4

## 2019-12-21 MED ORDER — ALBUMIN HUMAN 5 % IV SOLN
12.5000 g | Freq: Once | INTRAVENOUS | Status: AC
  Administered 2019-12-21: 12.5 g via INTRAVENOUS

## 2019-12-21 MED ORDER — STERILE WATER FOR INJECTION IV SOLN
INTRAVENOUS | Status: DC
  Filled 2019-12-21 (×6): qty 850

## 2019-12-21 MED ORDER — PROPOFOL 1000 MG/100ML IV EMUL
5.0000 ug/kg/min | INTRAVENOUS | Status: DC
  Administered 2019-12-21 (×2): 60 ug/kg/min via INTRAVENOUS
  Administered 2019-12-21: 5 ug/kg/min via INTRAVENOUS
  Administered 2019-12-22 (×5): 60 ug/kg/min via INTRAVENOUS
  Administered 2019-12-23 (×2): 55 ug/kg/min via INTRAVENOUS
  Administered 2019-12-23: 60 ug/kg/min via INTRAVENOUS
  Administered 2019-12-23: 55 ug/kg/min via INTRAVENOUS
  Administered 2019-12-23: 60 ug/kg/min via INTRAVENOUS
  Administered 2019-12-24 (×2): 56 ug/kg/min via INTRAVENOUS
  Filled 2019-12-21 (×3): qty 100
  Filled 2019-12-21: qty 200
  Filled 2019-12-21 (×6): qty 100
  Filled 2019-12-21: qty 200
  Filled 2019-12-21 (×2): qty 100

## 2019-12-21 MED ORDER — POTASSIUM CHLORIDE 10 MEQ/50ML IV SOLN
10.0000 meq | INTRAVENOUS | Status: AC
  Administered 2019-12-21 (×2): 10 meq via INTRAVENOUS
  Filled 2019-12-21 (×2): qty 50

## 2019-12-21 MED ORDER — SODIUM BICARBONATE 8.4 % IV SOLN
100.0000 meq | Freq: Once | INTRAVENOUS | Status: AC
  Administered 2019-12-21: 100 meq via INTRAVENOUS

## 2019-12-21 MED ORDER — ALBUMIN HUMAN 5 % IV SOLN
INTRAVENOUS | Status: AC
  Filled 2019-12-21: qty 250

## 2019-12-21 MED ORDER — SODIUM CHLORIDE 0.9 % IV SOLN
500.0000 mg | Freq: Once | INTRAVENOUS | Status: AC
  Administered 2019-12-21: 500 mg via INTRAVENOUS
  Filled 2019-12-21: qty 10

## 2019-12-21 MED ORDER — VASOPRESSIN 20 UNIT/ML IV SOLN
0.0400 [IU]/min | INTRAVENOUS | Status: DC
  Administered 2019-12-21 – 2019-12-24 (×5): 0.04 [IU]/min via INTRAVENOUS
  Filled 2019-12-21 (×7): qty 2

## 2019-12-21 MED ORDER — CLOPIDOGREL BISULFATE 75 MG PO TABS
75.0000 mg | ORAL_TABLET | Freq: Every day | ORAL | Status: DC
  Administered 2019-12-22: 75 mg via ORAL
  Filled 2019-12-21: qty 1

## 2019-12-21 MED ORDER — ORAL CARE MOUTH RINSE
15.0000 mL | OROMUCOSAL | Status: DC
  Administered 2019-12-21 – 2019-12-24 (×27): 15 mL via OROMUCOSAL

## 2019-12-21 MED ORDER — SODIUM CHLORIDE 0.9 % IV SOLN
4.0000 ug/kg/min | INTRAVENOUS | Status: AC
  Administered 2019-12-21 (×3): 4 ug/kg/min via INTRAVENOUS
  Filled 2019-12-21 (×2): qty 50

## 2019-12-21 MED ORDER — SODIUM CHLORIDE 0.9% FLUSH: 10.0000 mL | INTRAVENOUS | Status: DC | PRN

## 2019-12-21 MED ORDER — SODIUM BICARBONATE 8.4 % IV SOLN
INTRAVENOUS | Status: AC
  Filled 2019-12-21: qty 100

## 2019-12-21 MED ORDER — INSULIN ASPART 100 UNIT/ML ~~LOC~~ SOLN
0.0000 [IU] | SUBCUTANEOUS | Status: DC
  Administered 2019-12-21: 3 [IU] via SUBCUTANEOUS
  Administered 2019-12-21: 2 [IU] via SUBCUTANEOUS
  Administered 2019-12-21: 1 [IU] via SUBCUTANEOUS
  Administered 2019-12-21: 3 [IU] via SUBCUTANEOUS
  Administered 2019-12-21: 5 [IU] via SUBCUTANEOUS
  Administered 2019-12-22: 1 [IU] via SUBCUTANEOUS

## 2019-12-21 MED ORDER — POTASSIUM CHLORIDE 20 MEQ/15ML (10%) PO SOLN
40.0000 meq | ORAL | Status: AC
  Administered 2019-12-21 (×2): 40 meq via ORAL
  Filled 2019-12-21 (×2): qty 30

## 2019-12-21 MED ORDER — HEPARIN (PORCINE) 25000 UT/250ML-% IV SOLN
550.0000 [IU]/h | INTRAVENOUS | Status: DC
  Administered 2019-12-21: 650 [IU]/h via INTRAVENOUS
  Administered 2019-12-23: 550 [IU]/h via INTRAVENOUS
  Filled 2019-12-21 (×2): qty 250

## 2019-12-21 MED ORDER — MIDAZOLAM 50MG/50ML (1MG/ML) PREMIX INFUSION
5.0000 mg/h | INTRAVENOUS | Status: DC
  Administered 2019-12-21 – 2019-12-24 (×7): 5 mg/h via INTRAVENOUS
  Filled 2019-12-21 (×6): qty 50

## 2019-12-21 MED ORDER — SODIUM CHLORIDE 0.9 % IV BOLUS: 1000.0000 mL | Freq: Once | INTRAVENOUS | Status: DC

## 2019-12-21 MED ORDER — SODIUM CHLORIDE 0.9 % IV BOLUS
500.0000 mL | Freq: Once | INTRAVENOUS | Status: AC
  Administered 2019-12-21: 500 mL via INTRAVENOUS

## 2019-12-21 MED ORDER — EPINEPHRINE 1 MG/10ML IJ SOSY
PREFILLED_SYRINGE | INTRAMUSCULAR | Status: AC
  Filled 2019-12-21: qty 10

## 2019-12-21 MED ORDER — CHLORHEXIDINE GLUCONATE CLOTH 2 % EX PADS
6.0000 | MEDICATED_PAD | Freq: Every day | CUTANEOUS | Status: DC
  Administered 2019-12-22 – 2019-12-23 (×3): 6 via TOPICAL

## 2019-12-21 MED ORDER — SODIUM CHLORIDE 0.9 % IV SOLN
2000.0000 mg | Freq: Two times a day (BID) | INTRAVENOUS | Status: DC
  Administered 2019-12-21 – 2019-12-24 (×6): 2000 mg via INTRAVENOUS
  Filled 2019-12-21 (×7): qty 20

## 2019-12-21 MED ORDER — EPINEPHRINE HCL 5 MG/250ML IV SOLN IN NS
0.5000 ug/min | INTRAVENOUS | Status: DC
  Administered 2019-12-21 (×2): 20 ug/min via INTRAVENOUS
  Administered 2019-12-21: 22 ug/min via INTRAVENOUS
  Administered 2019-12-21: 10 ug/min via INTRAVENOUS
  Administered 2019-12-21: 17 ug/min via INTRAVENOUS
  Administered 2019-12-22 (×4): 20 ug/min via INTRAVENOUS
  Administered 2019-12-22: 22 ug/min via INTRAVENOUS
  Administered 2019-12-22 – 2019-12-23 (×5): 20 ug/min via INTRAVENOUS
  Administered 2019-12-23: 25 ug/min via INTRAVENOUS
  Administered 2019-12-23: 21 ug/min via INTRAVENOUS
  Administered 2019-12-23: 20 ug/min via INTRAVENOUS
  Administered 2019-12-23: 18 ug/min via INTRAVENOUS
  Administered 2019-12-24: 15 ug/min via INTRAVENOUS
  Administered 2019-12-24: 17 ug/min via INTRAVENOUS
  Filled 2019-12-21 (×11): qty 250
  Filled 2019-12-21: qty 500
  Filled 2019-12-21: qty 250
  Filled 2019-12-21: qty 500
  Filled 2019-12-21: qty 250
  Filled 2019-12-21 (×2): qty 500
  Filled 2019-12-21: qty 250

## 2019-12-21 MED ORDER — SODIUM BICARBONATE 8.4 % IV SOLN
INTRAVENOUS | Status: AC
  Filled 2019-12-21: qty 50

## 2019-12-21 MED ORDER — CHLORHEXIDINE GLUCONATE 0.12% ORAL RINSE (MEDLINE KIT)
15.0000 mL | Freq: Two times a day (BID) | OROMUCOSAL | Status: DC
  Administered 2019-12-21 – 2019-12-24 (×6): 15 mL via OROMUCOSAL

## 2019-12-21 MED ORDER — PERFLUTREN LIPID MICROSPHERE
1.0000 mL | INTRAVENOUS | Status: AC | PRN
  Administered 2019-12-21: 3 mL via INTRAVENOUS
  Filled 2019-12-21: qty 10

## 2019-12-21 MED ORDER — POTASSIUM CHLORIDE 10 MEQ/50ML IV SOLN
10.0000 meq | INTRAVENOUS | Status: DC
  Administered 2019-12-21 (×2): 10 meq via INTRAVENOUS
  Filled 2019-12-21 (×2): qty 50

## 2019-12-21 MED ORDER — LEVETIRACETAM IN NACL 1000 MG/100ML IV SOLN
1000.0000 mg | Freq: Two times a day (BID) | INTRAVENOUS | Status: DC
  Administered 2019-12-21: 1000 mg via INTRAVENOUS
  Filled 2019-12-21: qty 100

## 2019-12-21 MED ORDER — EPINEPHRINE HCL 5 MG/250ML IV SOLN IN NS
INTRAVENOUS | Status: AC
  Filled 2019-12-21: qty 250

## 2019-12-21 MED FILL — Nitroglycerin IV Soln 100 MCG/ML in D5W: INTRA_ARTERIAL | Qty: 10 | Status: AC

## 2019-12-21 MED FILL — Amiodarone HCl Inj 150 MG/3ML (50 MG/ML): INTRAVENOUS | Qty: 3 | Status: AC

## 2019-12-21 MED FILL — Atropine Sulfate Soln Prefill Syr 1 MG/10ML (0.1 MG/ML): INTRAMUSCULAR | Qty: 10 | Status: AC

## 2019-12-21 NOTE — Progress Notes (Signed)
Advanced Heart Failure Rounding Note   Subjective:    69 y/o male with COPD, ongoing tobacco use, seizure d/o. Admitted last night with cardiac arrest with significant time prior to CPR. Intubated in field.   Cath last night with occluded RCA and minimal left-sided CAD. EF 35-45%.   Post-cath course complicated by profound shock with RV failure and acidosis with initial MV sat in ICU 33%  I have been titrating pressors all night on him. Now on IABP 1:1, NE 50, EPI 17, milirnone 0.25  CVP 15 PA 35/22 (27) Thermo 3.8/2.3  Remains on vent unresponsive. EEG shows refractory status epilepticus.   Objective:   Weight Range:  Vital Signs:   Temp:  [92.8 F (33.8 C)-97.2 F (36.2 C)] 97.2 F (36.2 C) (06/22 1112) Pulse Rate:  [0-268] 92 (06/22 1112) Resp:  [0-41] 28 (06/22 1112) BP: (74-127)/(51-104) 127/81 (06/22 1100) SpO2:  [0 %-100 %] 100 % (06/22 1112) Arterial Line BP: (42-144)/(16-68) 118/55 (06/22 1100) FiO2 (%):  [70 %-100 %] 70 % (06/22 1112) Weight:  [52 kg-53.2 kg] 53.2 kg (06/21 2334)    Weight change: Filed Weights   01-13-20 2056 01/13/20 2334  Weight: 52 kg 53.2 kg    Intake/Output:   Intake/Output Summary (Last 24 hours) at 12/21/2019 1148 Last data filed at 12/21/2019 1100 Gross per 24 hour  Intake 3182.37 ml  Output 625 ml  Net 2557.37 ml     Physical Exam: General:  Frail cachetic appearing. On vent. Unresponsive HEENT: normal + ETT Neck: supple. JVP to ear . Carotids 2+ bilat; no bruits. No lymphadenopathy or thryomegaly appreciated. Cor: PMI nondisplaced. Regular rate & rhythm.Distant Lungs: coarse  Abdomen: soft, nontender, nondistended. No hepatosplenomegaly. No bruits or masses. Good bowel sounds. + cooling pads Extremities: no cyanosis, clubbing, rash, edema RFA IABP RFV swan Neuro: unresposnive  Telemetry: sinus 70s Personally reviewed   Labs: Basic Metabolic Panel: Recent Labs  Lab January 13, 2020 2048 12/21/19 0009 12/21/19 0106  12/21/19 0313 12/21/19 0602  NA 133* 139 142 142 142  K 4.2 3.5 3.8 2.9* 2.5*  CL 103  --  106  --  107  CO2 15*  --  24  --  20*  GLUCOSE 360*  --  363*  --  310*  BUN 13  --  17  --  18  CREATININE 1.30*  --  1.09  --  1.32*  CALCIUM 7.8*  --  6.6*  --  7.1*  MG  --   --  2.8*  --  2.4  PHOS  --   --   --   --  2.6    Liver Function Tests: Recent Labs  Lab 01/13/20 2048  AST 85*  ALT 49*  ALKPHOS 102  BILITOT 0.3  PROT 4.9*  ALBUMIN 2.8*   No results for input(s): LIPASE, AMYLASE in the last 168 hours. No results for input(s): AMMONIA in the last 168 hours.  CBC: Recent Labs  Lab Jan 13, 2020 2048 12/21/19 0009 12/21/19 0106 12/21/19 0313 12/21/19 0602  WBC 15.3*  --  18.3*  --  20.9*  NEUTROABS 11.3*  --   --   --   --   HGB 13.1 11.2* 9.9* 7.8* 8.2*  HCT 39.1 33.0* 29.2* 23.0* 24.3*  MCV 106.8*  --  104.7*  --  106.1*  PLT 159  --  182  --  140*    Cardiac Enzymes: No results for input(s): CKTOTAL, CKMB, CKMBINDEX, TROPONINI in the last 168  hours.  BNP: BNP (last 3 results) No results for input(s): BNP in the last 8760 hours.  ProBNP (last 3 results) No results for input(s): PROBNP in the last 8760 hours.    Other results:  Imaging: CARDIAC CATHETERIZATION  Addendum Date: 12/11/2019    Mid RCA lesion is 50% stenosed.  Prox RCA lesion is 100% stenosed.  A drug-eluting stent was successfully placed using a STENT RESOLUTE ONYX 3.0X22.  Post intervention, there is a 0% residual stenosis.  The left ventricular ejection fraction is 35-45% by visual estimate.  There is moderate left ventricular systolic dysfunction.  LV end diastolic pressure is low.  There is no aortic valve stenosis.  Ao sat 100%, PA sat 51%, PA 32/12; mean PA pressure 21 mm Hg, mean PCWP 7 mm Hg; RA pressure 11; CO 2.67 L/min; CI 1.52  Successful IABP placement via right femoral artery.  Cardiogenic shock likely from RV failure in the setting of inferior MI.  Will aggressively  hydrate given low LVEDP and RA pressure > PCWP/LVEDP.  Continue IV Amio.  IV Cangrelor bridge until OG tube placed and Brilinta can be given.  Continue Cangrelor 4 hours after the Brilinta load since he will be cooled. Prognosis will depend on neuro recovery.  I updated his daughter who was in the waiting room.   Result Date: 12/25/2019  Mid RCA lesion is 50% stenosed.  Prox RCA lesion is 100% stenosed.  A drug-eluting stent was successfully placed using a STENT RESOLUTE ONYX 3.0X22.  Post intervention, there is a 0% residual stenosis.  The left ventricular ejection fraction is 35-45% by visual estimate.  There is moderate left ventricular systolic dysfunction.  LV end diastolic pressure is low.  There is no aortic valve stenosis.  Ao sat 100%, PA sat 51%, PA 32/12; mean PA pressure 21 mm Hg, mean PCWP 7 mm Hg; RA pressure 11; CO 2.67 L/min; CI 1.52  Successful IABP placement via right femoral artery.  Cardiogenic shock likely from RV failure in the setting of inferior MI.  Will aggressively hydrate given low LVEDP and RA pressure > PCWP/LVEDP.  Continue IV Amio.  IV Cangrelor bridge until OG tube placed and Brilinta can be given.  Continue Cangrelor 4 hours after the Brilinta load since he will be cooled. Prognosis will depend on neuro recovery.    DG CHEST PORT 1 VIEW  Result Date: 12/21/2019 CLINICAL DATA:  Management of intra-aortic balloon pump. EXAM: PORTABLE CHEST 1 VIEW COMPARISON:  Radiograph yesterday. FINDINGS: Endotracheal tube tip is at the level of the clavicular heads. Enteric tube in place, tip below the diaphragm, however the side-port in the region of the gastroesophageal junction. There is Swan-Ganz catheter from an inferior approach with tip in the region of the right pulmonary outflow tract. Intra-aortic balloon pump with radiopaque marker projecting over the aortic arch. Heart is normal in size. Diffuse interstitial thickening likely pulmonary edema superimposed on emphysema.  There is no pneumothorax or confluent airspace disease. Trace fluid in the right minor fissure. IMPRESSION: 1. Endotracheal tube tip at the level of the clavicular heads. Enteric tube in place, tip below the diaphragm, however side-port in the region of the gastroesophageal junction. Recommend advancement of at least 3 cm for optimal placement. 2. Swan-Ganz catheter from an inferior approach with tip in the right pulmonary outflow tract. Intra-aortic balloon pump with radiopaque marker projecting over the aortic arch. 3. Interstitial thickening likely pulmonary edema superimposed on emphysema. Electronically Signed   By: Ivette Loyal.D.  On: 12/21/2019 00:29   DG Chest Portable 1 View  Result Date: 12/01/2019 CLINICAL DATA:  Status post intubation. EXAM: PORTABLE CHEST 1 VIEW COMPARISON:  May 30, 2014. FINDINGS: The heart size and mediastinal contours are within normal limits. Endotracheal tube is seen in grossly good position. No pneumothorax or pleural effusion is noted. Hyperexpansion of the lungs is noted. Mild interstitial densities are noted throughout both lungs which may represent scarring, but acute superimposed edema or inflammation cannot be excluded. The visualized skeletal structures are unremarkable. IMPRESSION: Endotracheal tube in grossly good position. Hyperexpansion of the lungs. Mild interstitial densities are noted throughout both lungs which may represent scarring, but acute superimposed edema or inflammation cannot be excluded. Electronically Signed   By: Lupita Raider M.D.   On: 12/22/2019 20:58   DG Abd Portable 1V  Result Date: 12/21/2019 CLINICAL DATA:  Orogastric tube placement EXAM: PORTABLE ABDOMEN - 1 VIEW COMPARISON:  None. FINDINGS: Side port of the orogastric tube is in the distal esophagus, just above the gastroesophageal junction. Recommend advancing by 7 cm. IMPRESSION: Side port of the orogastric tube in the distal esophagus. Recommend advancing by 7 cm to  ensure intragastric positioning. Electronically Signed   By: Deatra Robinson M.D.   On: 12/21/2019 00:17   EEG adult  Result Date: 12/21/2019 Charlsie Quest, MD     12/21/2019 10:22 AM .Patient Name: Franklin Flores MRN: [id number] Epilepsy Attending: Charlsie Quest Referring Physician/Provider: Dr. Levon Hedger Date: 12/21/2019 Duration: 25.21 minutes Patient history: 70 year old male with history of seizures status post cardiac arrest.  EEG evaluate for seizures Level of alertness: comatose AEDs during EEG study: Phenytoin, Keppra, propofol, Versed Technical aspects: This EEG study was done with scalp electrodes positioned according to the 10-20 International system of electrode placement. Electrical activity was acquired at a sampling rate of 500Hz  and reviewed with a high frequency filter of 70Hz  and a low frequency filter of 1Hz . EEG data were recorded continuously and digitally stored. Description: EEG showed burst suppression pattern with generalized EEG suppression lasting 6 to 10 seconds with generalized bursts of highly epileptiform discharges lasting 2 to 5 seconds.  EEG was not reactive to tactile stimulation.  Hyperventilation and photic stimulation were not performed.   ABNORMALITY -Burst suppression with highly epileptiform discharges IMPRESSION: This study showed evidence of generalized epileptogenic city and profound diffuse encephalopathy nonspecific to etiology but could be related to diffuse anoxic/hypoxic brain injury.   ECHOCARDIOGRAM COMPLETE  Result Date: 12/21/2019    ECHOCARDIOGRAM REPORT   Patient Name:   [facility] Date of Exam: 12/21/2019 Medical Rec #:  12/23/2019    Height:       70.0 in Accession #:    [facility]   Weight:       117.3 lb Date of Birth:  1950-12-18    BSA:          1.664 m Patient Age:    69 years     BP:           90/64 mmHg Patient Gender: M            HR:           112 bpm. Exam Location:  Inpatient Procedure: 2D Echo, Cardiac Doppler, Color  Doppler and Intracardiac            Opacification Agent STAT ECHO Indications:    Cardiac arrest I46.9  History:        Patient has no prior  history of Echocardiogram examinations.                 COPD, Arrythmias:Cardiac Arrest and STEMI; Risk Factors:Current                 Smoker.  Sonographer:    Vickie Epley RDCS Referring Phys: [id number] Candee Furbish  Sonographer Comments: Echo performed with patient supine and on artificial respirator. IMPRESSIONS  1. The inferior wall is not adequately visualized to comment on wall motion abnormality.     .Left ventricular ejection fraction, by estimation, is 60 to 65%. The left ventricle has normal function. The left ventricle has no regional wall motion abnormalities. Left ventricular diastolic function could not be evaluated. Abnormal (paradoxical)  septal motion, consistent with RV pacemaker.  2. Right ventricular systolic function is severely reduced. The right ventricular size is severely enlarged. There is normal pulmonary artery systolic pressure. The estimated right ventricular systolic pressure is 33.8 mmHg.  3. The mitral valve is normal in structure. No evidence of mitral valve regurgitation. No evidence of mitral stenosis.  4. The aortic valve has an indeterminant number of cusps. Aortic valve regurgitation is not visualized. Mild aortic valve sclerosis is present, with no evidence of aortic valve stenosis.  5. The inferior vena cava is normal in size with greater than 50% respiratory variability, suggesting right atrial pressure of 3 mmHg. FINDINGS  Left Ventricle: The inferior wall is not adequately visualized to comment on wall motion abnormality. Left ventricular ejection fraction, by estimation, is 60 to 65%. The left ventricle has normal function. The left ventricle has no regional wall motion abnormalities. Definity contrast agent was given IV to delineate the left ventricular endocardial borders. The left ventricular internal cavity size was normal in  size. There is no left ventricular hypertrophy. Abnormal (paradoxical) septal motion, consistent with RV pacemaker. Left ventricular diastolic function could not be evaluated. Right Ventricle: The right ventricular size is severely enlarged. No increase in right ventricular wall thickness. Right ventricular systolic function is severely reduced. There is normal pulmonary artery systolic pressure. The tricuspid regurgitant velocity is 2.04 m/s, and with an assumed right atrial pressure of 3 mmHg, the estimated right ventricular systolic pressure is 25.0 mmHg. Left Atrium: Left atrial size was normal in size. Right Atrium: Right atrial size was not well visualized. Pericardium: There is no evidence of pericardial effusion. Mitral Valve: The mitral valve is normal in structure. Normal mobility of the mitral valve leaflets. No evidence of mitral valve regurgitation. No evidence of mitral valve stenosis. Tricuspid Valve: The tricuspid valve is normal in structure. Tricuspid valve regurgitation is mild . No evidence of tricuspid stenosis. Aortic Valve: The aortic valve has an indeterminant number of cusps. Aortic valve regurgitation is not visualized. Mild aortic valve sclerosis is present, with no evidence of aortic valve stenosis. Pulmonic Valve: The pulmonic valve was normal in structure. Pulmonic valve regurgitation is not visualized. No evidence of pulmonic stenosis. Aorta: The aortic root is normal in size and structure. Venous: The inferior vena cava is normal in size with greater than 50% respiratory variability, suggesting right atrial pressure of 3 mmHg. IAS/Shunts: No atrial level shunt detected by color flow Doppler. Additional Comments: A pacer wire is visualized.  LEFT VENTRICLE PLAX 2D LVIDd:         3.60 cm LVIDs:         2.80 cm LV PW:         0.70 cm LV IVS:  0.70 cm LVOT diam:     1.70 cm LV SV:         23 LV SV Index:   14 LVOT Area:     2.27 cm  LV Volumes (MOD) LV vol d, MOD A4C: 43.5 ml LV  vol s, MOD A4C: 25.7 ml LV SV MOD A4C:     43.5 ml RIGHT VENTRICLE RV S prime:     7.53 cm/s TAPSE (M-mode): 1.6 cm LEFT ATRIUM           Index      RIGHT ATRIUM          Index LA diam:      2.20 cm 1.32 cm/m RA Area:     6.93 cm LA Vol (A4C): 15.0 ml 9.02 ml/m RA Volume:   11.20 ml 6.73 ml/m  AORTIC VALVE LVOT Vmax:   73.90 cm/s LVOT Vmean:  46.800 cm/s LVOT VTI:    0.103 m  AORTA Ao Asc diam: 3.20 cm MITRAL VALVE               TRICUSPID VALVE MV Area (PHT): 3.99 cm    TR Peak grad:   16.6 mmHg MV Decel Time: 190 msec    TR Vmax:        204.00 cm/s MV E velocity: 43.70 cm/s MV A velocity: 42.20 cm/s  SHUNTS MV E/A ratio:  1.04        Systemic VTI:  0.10 m                            Systemic Diam: 1.70 cm Armanda Magic MD Electronically signed by Armanda Magic MD Signature Date/Time: 12/21/2019/10:07:29 AM    Final       Medications:     Scheduled Medications: . aspirin  81 mg Oral Daily  . Chlorhexidine Gluconate Cloth  6 each Topical Daily  . [START ON 12/22/2019] clopidogrel  75 mg Oral Daily  . docusate  100 mg Oral BID  . fentaNYL (SUBLIMAZE) injection  25 mcg Intravenous Once  . insulin aspart  0-9 Units Subcutaneous Q4H  . insulin detemir  10 Units Subcutaneous BID  . pantoprazole (PROTONIX) IV  40 mg Intravenous Daily  . phenytoin  100 mg Per Tube TID  . polyethylene glycol  17 g Oral Daily  . sodium chloride flush  3 mL Intravenous Q12H     Infusions: . sodium chloride Stopped (12/21/19 0958)  . sodium chloride    . sodium chloride 10 mL/hr at 12/21/19 0129  . albumin human    . amiodarone 30 mg/hr (12/21/19 0554)  . epinephrine 17 mcg/min (12/21/19 1100)  . fentaNYL infusion INTRAVENOUS Stopped (12/21/19 0202)  . heparin 650 Units/hr (12/21/19 1100)  . levETIRAcetam Stopped (12/21/19 2130)  . milrinone 0.25 mcg/kg/min (12/21/19 1100)  . norepinephrine (LEVOPHED) Adult infusion 50 mcg/min (12/21/19 1100)  . potassium chloride    . propofol (DIPRIVAN) infusion 20  mcg/kg/min (12/21/19 1100)  .  sodium bicarbonate (isotonic) infusion in sterile water 50 mL/hr at 12/21/19 1100  . vasopressin (PITRESSIN) infusion - *FOR SHOCK* 0.04 Units/min (12/21/19 1100)     PRN Medications:  sodium chloride, acetaminophen, fentaNYL, ipratropium-albuterol, midazolam, ondansetron (ZOFRAN) IV, ondansetron (ZOFRAN) IV, sodium chloride flush   Assessment/Plan:   1. VT/VF cardiac arrest 2. 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 Acute hypoxic respiratory failure  5. Severe anoxic brain  injury  From cardiac perspective he has now been stabilized with IABP, fluid resusciation and extensive titration of nearly max dose triple pressors.   Major issue now appears to be severe anoxic brain injury and refractory seizures.   Discussed at length with CCM and family at bedside.   Will make DNR (no shock or CPR). If unable to break seizures soon will need to consider withdrawal of care later today  Total CCT throughout early am hours titrating drips and this am rounds 2.5 hours.   Arvilla Meresaniel Caelin Rosen, MD  11:58 AM  Advanced Heart Failure Team Pager [phone] (M-F; 7a - 4p)  Please contact [facility] for night-coverage after hours (4p -7a ) and weekends on [email or web address]

## 2019-12-21 NOTE — Progress Notes (Signed)
eLink Physician-Brief Progress Note Patient Name: Franklin Flores DOB: Apr 17, 1951 MRN: [id number]   Date of Service  12/21/2019  HPI/Events of Note  Hyperglycemia - Blood glucose = 298.  eICU Interventions  Plan: 1. Q 4 hour sensitive Novolog SSI.     Intervention Category Major Interventions: Hyperglycemia - active titration of insulin therapy  Lenell Antu 12/21/2019, 4:44 AM

## 2019-12-21 NOTE — Procedures (Addendum)
.  Patient Name: Franklin Flores  MRN: [id number]  Epilepsy Attending: Charlsie Quest  Referring Physician/Provider: Dr. Levon Hedger Date: 12/21/2019 Duration: 25.21 minutes  Patient history: 69 year old male with history of seizures status post cardiac arrest.  EEG evaluate for seizures  Level of alertness: comatose  AEDs during EEG study: Phenytoin, Keppra, propofol, Versed  Technical aspects: This EEG study was done with scalp electrodes positioned according to the 10-20 International system of electrode placement. Electrical activity was acquired at a sampling rate of 500Hz  and reviewed with a high frequency filter of 70Hz  and a low frequency filter of 1Hz . EEG data were recorded continuously and digitally stored.   Description: EEG showed burst suppression pattern with generalized EEG suppression lasting 6 to 10 seconds with generalized bursts of highly epileptiform discharges lasting 2 to 5 seconds.  EEG was not reactive to tactile stimulation.  Hyperventilation and photic stimulation were not performed.     ABNORMALITY -Burst suppression with highly epileptiform discharges  IMPRESSION: This study showed evidence of generalized epileptogenicity and profound diffuse encephalopathy nonspecific to etiology but could be related to diffuse anoxic/hypoxic brain injury.    Sherle Mello 

## 2019-12-21 NOTE — Plan of Care (Signed)
LTM eeg reviewed again. Continues to show burst suppression with highly epileptiform bursts. Will start versed at 89ml/hr and titrate as much as tolerated. Also increased LEV to 2000mg  BID.    Caidin Heidenreich 

## 2019-12-21 NOTE — Progress Notes (Signed)
LTM EEG hooked up and running - no initial skin breakdown - push button tested - neuro notified.  Same leads used.  

## 2019-12-21 NOTE — Progress Notes (Addendum)
ANTICOAGULATION CONSULT NOTE  Pharmacy Consult for Heparin Indication: IABP  No Known Allergies  Patient Measurements: Height: 5\' 10"  (177.8 cm) Weight: 53.2 kg (117 lb 4.6 oz) IBW/kg (Calculated) : 73   Vital Signs: Temp: 96.4 F (35.8 C) (06/22 1531) Temp Source: Core (Comment) (06/22 1200) BP: 114/74 (06/22 1500) Pulse Rate: 73 (06/22 1531)  Labs: Recent Labs    12/21/2019 2048 12/09/2019 2115 12/21/19 0106 12/21/19 0106 12/21/19 0313 12/21/19 0602 12/21/19 1302 12/21/19 1438  HGB 13.1   < > 9.9*   < > 7.8* 8.2*  --   --   HCT 39.1   < > 29.2*  --  23.0* 24.3*  --   --   PLT 159  --  182  --   --  140*  --   --   APTT 41*  --   --   --   --   --   --   --   LABPROT 16.3*  --   --   --   --   --   --   --   INR 1.4*  --   --   --   --   --   --   --   HEPARINUNFRC  --   --   --   --   --   --   --  0.57  CREATININE 1.30*   < > 1.09  --   --  1.32* 1.11  --   TROPONINIHS 728*  --  >27,000*  --   --   --   --   --    < > = values in this interval not displayed.    Estimated Creatinine Clearance: 48.6 mL/min (by C-G formula based on SCr of 1.11 mg/dL).   Medical History: Past Medical History:  Diagnosis Date  . Cataract   . COPD (chronic obstructive pulmonary disease) (HCC)   . Seizures (HCC)   . Smoker   . Smoker     Medications:  Medications Prior to Admission  Medication Sig Dispense Refill Last Dose  . phenytoin (DILANTIN) 100 MG ER capsule TAKE 3 CAPSULES (300 MG TOTAL) BY MOUTH DAILY. (Patient taking differently: Take 300 mg by mouth daily. ) 270 capsule 3 Past Week at Unknown time    Assessment: 69 y.o. male admitted with VFib cardiac arrest, s/p PCI and now on IABP on heparin.  Plans noted for possible withdrawal of care -heparin level = 0.57, hg= 8.2, plt= 140  Rn noted some oozing from IABP site  Goal of Therapy:  Heparin level 0.2-0.5 units/mL Monitor platelets by anticoagulation protocol: Yes   Plan:  -Decrease heparin to 550  units/hr -Heparin level in 6 hours and daily wth CBC daily  79, PharmD Clinical Pharmacist **Pharmacist phone directory can now be found on [email or web address] ([email or web address]).  Listed under [facility].

## 2019-12-21 NOTE — Consult Note (Addendum)
Neurology Consultation Reason for Consult: Status epilepticus Referring Physician: Dr. Levon Hedger  CC: Cardiac arrest  History is obtained from: Wife at bedside, chart review as patient is intubated, sedated  HPI: Franklin Flores is a 69 y.o. male with history of COPD, epilepsy well-controlled on monotherapy Dilantin (last seizure more than 15 years ago per wife at bedside) who was brought in yesterday after cardiac arrest at home.  EMS was called, possible downtime of 7 minutes without CPR.  On arrival to ED, patient was noted to have inferior ST elevation myocardial infarction and was taken for emergent cardiac catheterization s/p PCI with stent to RCA .  On evaluation this morning, patient was noted to have grand mal seizure-like episodes and therefore stat EEG was ordered which showed burst suppression with highly epileptiform discharges. Therefore neurology was consulted for further management.   ROS:  Unable to obtain due to altered mental status.   Past Medical History:  Diagnosis Date  . Cataract   . COPD (chronic obstructive pulmonary disease) (HCC)   . Seizures (HCC)   . Smoker   . Smoker     Family History  Problem Relation Age of Onset  . Drug abuse Brother   . Alcohol abuse Brother   . Colon cancer Neg Hx     Social History: Per chart review  he has been smoking. He has a 23.50 pack-year smoking history. He has never used smokeless tobacco. He reports current alcohol use. He reports that he does not use drugs.  Exam: Current vital signs: BP 115/76   Pulse 72   Temp (!) 95.9 F (35.5 C)   Resp 20   Ht 5\' 10"  (1.778 m)   Wt 53.2 kg   SpO2 100%   BMI 16.83 kg/m  Vital signs in last 24 hours: Temp:  [92.8 F (33.8 C)-97.2 F (36.2 C)] 95.9 F (35.5 C) (06/22 1300) Pulse Rate:  [0-268] 72 (06/22 1300) Resp:  [0-41] 20 (06/22 1300) BP: (74-127)/(51-104) 115/76 (06/22 1300) SpO2:  [0 %-100 %] 100 % (06/22 1300) Arterial Line BP: (42-144)/(16-68) 99/45 (06/22  1300) FiO2 (%):  [70 %-100 %] 70 % (06/22 1200) Weight:  [52 kg-53.2 kg] 53.2 kg (06/21 2334)   Physical Exam  Constitutional: Laying in bed, cachectic looking  Psych: Unable to assess secondary to intubation, sedation  Cardiovascular: Normal rate and regular rhythm.  Respiratory: Intubated, coarse breath sounds bilaterally  GI: Soft.  No distension Skin: Cool, no apparent rash Neuro: Comatose, pupils equal round and sluggishly reacting, corneal reflex absent, does not withdraw to noxious stimuli in all extremities  I have reviewed labs in epic and the results pertinent to this consultation are: WBC 20.9, hemoglobin 8.2, blood glucose 310, calcium 7.1, magnesium 2.8, lactic acid 7.1, potassium 2.5, creatinine 1.32 with normal BUN  I have reviewed the images obtained: CT head not performed.  ASSESSMENT/PLAN: 69 year old male with history of epilepsy which per wife was well controlled on Dilantin monotherapy.  Patient suffered a cardiac arrest and was noted to have generalized tonic-clonic seizure-like activity.  Concomitant EEG showed burst suppression pattern with epileptiform discharges as well as frequent seizures consistent with convulsive status epilepticus.  Convulsive status epilepticus History of epilepsy Suspected diffuse anoxic brain injury Cardiac arrest Myocardial infarction status post PCI Anemia Leukocytosis Lactic acidosis Hypocalcemia Hypokalemia AKI -Stat EEG showed burst suppression with highly epileptiform bursts.  LTM EEG showed frequent seizures consistent with convulsive status epilepticus.  Recommendations -Continue Keppra 1000 mg twice daily, Dilantin 100  mg 3 times daily -Patient was on propofol 65mcg/hr and already on multiple vasopressors.  Recommended increasing propofol to 23mcg/hr cautiously and keep a close eye on blood pressure.  If patient becomes hypotensive, we will have to reduce propofol back to 27mcg/hr -Ideally would like to obtain CT head  without contrast however at this point patient appears to be unstable for transfer and it will likely not change immediate management. -Patient's wife was at bedside.  I discussed in detail patient's current clinical presentation and my concern that patient has suffered severe irreversible brain damage due to anoxic brain injury, stopping seizures at this point might be difficult.  Patient's wife became tearful and stated she will talk to patient's daughter.  She did say that patient would not want to live like this and that she is considering comfort care -Continue seizure precautions, will slowly titrate sedation as much as tolerated by patient to stop/minimize seizures  -Management of rest of the comorbidities per primary team  CRITICAL CARE Performed by: Lora Havens   Total critical care time: 45 minutes  Critical care time was exclusive of separately billable procedures and treating other patients.  Critical care was necessary to treat or prevent imminent or life-threatening deterioration.  Critical care was time spent personally by me on the following activities: development of treatment plan with patient and/or surrogate as well as nursing, discussions with consultants, evaluation of patient's response to treatment, examination of patient, obtaining history from patient or surrogate, ordering and performing treatments and interventions, ordering and review of laboratory studies, ordering and review of radiographic studies, pulse oximetry and re-evaluation of patient's condition.    Zeb Comfort Epilepsy [facility]

## 2019-12-21 NOTE — Progress Notes (Signed)
CDS notified of this pt per bedside RN request.  Pt's GCS at this time is 8.  Spoke with Deanna Early from CDS.  Referral # [id number]. Per CDS, Kyla Balzarine, coordinator, will be contacting bedside RN at [phone]. Information also documented in doc flowsheet.

## 2019-12-21 NOTE — Progress Notes (Signed)
NAME:  Franklin Flores, MRN:  [id number], DOB:  [birth year], LOS: 1 ADMISSION DATE:  12/09/2019, CONSULTATION DATE: 12/23/2019 REFERRING MD: Irish Lack, CHIEF COMPLAINT: Status post cardiac arrest  Brief History   69 year old male who arrested at home  History of present illness   Patient is a 69 year old male with probable history of COPD seizure disorder for witnessed arrest at home.  No CPR was initiated until EMS arrived about 7 minutes after his initial arrest.  He was initially in torsades shocked received magnesium and epinephrine and round of being shocked twice more.  In the ER he was started on amiodarone found to have ST elevation in inferior leads with urgent cardiac catheterization performed. My evaluation at bedside the patient does have some spontaneous movement.  He is ventilated.  He is on Levophed 30 mics along with amiodarone, he is mechanically ventilated.  Balloon pump is in place.  Past Medical History   . Cataract   . COPD (chronic obstructive pulmonary disease) ([place])   . Seizures ([place])   . Smoker      Significant Hospital Events   Resuscitated emergently intubated 12/06/2019 Cardiac catheterization 12/16/2019  Consults:  PCCM  Procedures:  As above  Significant Diagnostic Tests:  Initial electrolytes are essentially normal slight elevation of LFTs  Micro Data:  NA  Antimicrobials:  NA  Interim history/subjective:  In status epilepticus on my arrival.   Objective   Blood pressure 103/88, pulse (!) 102, temperature (!) 95.9 F (35.5 C), resp. rate (!) 29, height 5\' 10"  (1.778 m), weight 53.2 kg, SpO2 95 %. PAP: (25-44)/(13-25) 38/18 CVP:  [4 mmHg-16 mmHg] 8 mmHg PCWP:  [8 mmHg] 8 mmHg CO:  [2.1 L/min-3.1 L/min] 3.1 L/min CI:  [1.2 L/min/m2-1.9 L/min/m2] 1.9 L/min/m2  Vent Mode: PRVC FiO2 (%):  [80 %-100 %] 90 % Set Rate:  [20 bmp-24 bmp] 24 bmp Vt Set:  [500 mL-580 mL] 580 mL PEEP:  [5 cmH20] 5 cmH20 Plateau Pressure:  [15 cmH20-18 cmH20] 15  cmH20   Intake/Output Summary (Last 24 hours) at 12/21/2019 4098 Last data filed at 12/21/2019 0700 Gross per 24 hour  Intake 2086.69 ml  Output 530 ml  Net 1556.69 ml   Filed Weights   12/22/2019 2056 12/06/2019 2334  Weight: 52 kg 53.2 kg    Examination: GEN: chronically ill man seizing HEENT: ETT in place, thick white secretions CV: Tachycardic, ext lukewarm PULM: Rhonci bilaterally, +accessory muscle use GI: Soft, hypoactive BS EXT: Muscle wasting NEURO: grand mal seizures limits exam PSYCH: cannot assess SKIN: ashen  HypoK- replete Low Ca- replete RHC Ao sat 100%, PA sat 51%, PA 32/12; mean PA pressure 21 mm Hg, mean PCWP 7 mm Hg; RA pressure 11; CO 2.67 L/min; CI 1.52   Resolved Hospital Problem list   NA  Assessment & Plan:  Status post cardiac arrest: Initial rhythm V. fib with prolonged time from initial arrest to CPR.  CT Head not done for some reason.  Had emergent PCI with stent to RCA.  Status epilepticus, has hx of seizures - vEEG - PTA dilantin, add keppra and propofol - Needs a head CT if we can get him more stabilized  Respiratory failure in setting of cardiac arrest Hx COPD - Continue vent support, VAP bundle - ABGs PRN - Nebs PRN  Profound shock related to RV failure- IABP in place, on high dose pressors, cardiology following - Bolus more LR, see how responds - Will be tricky suppressing seizures without worsening cardiovascular collapse  Best practice:  Diet: N.p.o. for now Pain/Anxiety/Delirium protocol (if indicated): See above VAP protocol (if indicated): Yes DVT prophylaxis: Yes GI prophylaxis: Pepcid Glucose control: Monitor Mobility: Bedrest Code Status: Full Family Communication: calling family Disposition: To ICU for further therapy   The patient is critically ill with multiple organ systems failure and requires high complexity decision making for assessment and support, frequent evaluation and titration of therapies, application  of advanced monitoring technologies and extensive interpretation of multiple databases. Critical Care Time devoted to patient care services described in this note independent of APP/resident time (if applicable)  is 41 minutes.   Myrla Halsted MD [facility] 12/21/2019 8:31 AM Personal pager: [phone] If unanswered, please page CCM On-call: [phone]

## 2019-12-21 NOTE — Progress Notes (Signed)
   12/21/19 1100  Clinical Encounter Type  Visited With Patient and family together  Visit Type Initial;Follow-up  Referral From Chaplain  Consult/Referral To Chaplain  Spiritual Encounters  Spiritual Needs Prayer;Emotional   Chaplain engaged in initial visit, a follow-up request from night shift chaplain.  Chaplain introduced herself and provided support and prayer around family.  Chaplain was able to meet Shawnte's wife and wife's friend.    Wife was very emotional at [facility] bedside and stated that she cannot live without her husband.  Chaplain will continue to follow-up.

## 2019-12-21 NOTE — Progress Notes (Signed)
ANTICOAGULATION CONSULT NOTE - Initial Consult  Pharmacy Consult for Heparin Indication: IABP  No Known Allergies  Patient Measurements: Height: 5\' 10"  (177.8 cm) Weight: 53.2 kg (117 lb 4.6 oz) IBW/kg (Calculated) : 73   Vital Signs: Temp: 94.6 F (34.8 C) (06/22 0240) Temp Source: Bladder (06/21 2340) BP: 111/62 (06/22 0210) Pulse Rate: 100 (06/22 0205)  Labs: Recent Labs    01/07/2020 2048 01-07-20 2048 12/21/19 0009 12/21/19 0009 12/21/19 0106 12/21/19 0313  HGB 13.1   < > 11.2*   < > 9.9* 7.8*  HCT 39.1   < > 33.0*  --  29.2* 23.0*  PLT 159  --   --   --  182  --   APTT 41*  --   --   --   --   --   LABPROT 16.3*  --   --   --   --   --   INR 1.4*  --   --   --   --   --   CREATININE 1.30*  --   --   --  1.09  --   TROPONINIHS 728*  --   --   --  >27,000*  --    < > = values in this interval not displayed.    Estimated Creatinine Clearance: 49.5 mL/min (by C-G formula based on SCr of 1.09 mg/dL).   Medical History: Past Medical History:  Diagnosis Date  . Cataract   . COPD (chronic obstructive pulmonary disease) (HCC)   . Seizures (HCC)   . Smoker   . Smoker     Medications:  Medications Prior to Admission  Medication Sig Dispense Refill Last Dose  . ENSURE PLUS (ENSURE PLUS) LIQD Take 237 mLs by mouth daily.     . Multiple Vitamins-Minerals (CENTRUM SILVER PO) Take 1 tablet by mouth daily.     . phenytoin (DILANTIN) 100 MG ER capsule TAKE 3 CAPSULES (300 MG TOTAL) BY MOUTH DAILY. 270 capsule 3     Assessment: 69 y.o. male admitted with VFib cardiac arrest, s/p PCI and now on IABP, for heparin.   Goal of Therapy:  Heparin level 0.2-0.5 units/mL Monitor platelets by anticoagulation protocol: Yes   Plan:  Start heparin 650 units/hr  Check heparin level in 8 hours.   79 12/21/2019,4:18 AM

## 2019-12-21 NOTE — Progress Notes (Signed)
  Echocardiogram 2D Echocardiogram has been performed.  Franklin Flores 12/21/2019, 9:39 AM

## 2019-12-21 NOTE — Progress Notes (Signed)
EEG complete - results pending Same leads used for LTM to follow

## 2019-12-21 NOTE — Progress Notes (Signed)
EEG seems to have settled out with higher doses of propofol. Still too unstable for head CT. Family in agreement to continue current level of care but allow natural death if takes turn for worse. DNR

## 2019-12-22 DIAGNOSIS — I2102 ST elevation (STEMI) myocardial infarction involving left anterior descending coronary artery: Secondary | ICD-10-CM

## 2019-12-22 LAB — BILIRUBIN, DIRECT: Bilirubin, Direct: 0.4 mg/dL — ABNORMAL HIGH (ref 0.0–0.2)

## 2019-12-22 LAB — ABO/RH: ABO/RH(D): O NEG

## 2019-12-22 LAB — PROTIME-INR
INR: 1.8 — ABNORMAL HIGH (ref 0.8–1.2)
Prothrombin Time: 20 seconds — ABNORMAL HIGH (ref 11.4–15.2)

## 2019-12-22 LAB — HEPARIN LEVEL (UNFRACTIONATED)
Heparin Unfractionated: 0.49 IU/mL (ref 0.30–0.70)
Heparin Unfractionated: 0.33 IU/mL (ref 0.30–0.70)

## 2019-12-22 LAB — BASIC METABOLIC PANEL
Anion gap: 9 (ref 5–15)
Anion gap: 8 (ref 5–15)
BUN: 25 mg/dL — ABNORMAL HIGH (ref 8–23)
BUN: 29 mg/dL — ABNORMAL HIGH (ref 8–23)
CO2: 24 mmol/L (ref 22–32)
CO2: 23 mmol/L (ref 22–32)
Calcium: 6.7 mg/dL — ABNORMAL LOW (ref 8.9–10.3)
Calcium: 6.2 mg/dL — CL (ref 8.9–10.3)
Chloride: 110 mmol/L (ref 98–111)
Chloride: 110 mmol/L (ref 98–111)
Creatinine, Ser: 1.13 mg/dL (ref 0.61–1.24)
Creatinine, Ser: 1.11 mg/dL (ref 0.61–1.24)
GFR calc Af Amer: >60 mL/min (ref >60)
GFR calc Af Amer: >60 mL/min (ref >60)
GFR calc non Af Amer: >60 mL/min (ref >60)
GFR calc non Af Amer: >60 mL/min (ref >60)
Glucose, Bld: 121 mg/dL — ABNORMAL HIGH (ref 70–99)
Glucose, Bld: 116 mg/dL — ABNORMAL HIGH (ref 70–99)
Potassium: 3.6 mmol/L (ref 3.5–5.1)
Potassium: 4.1 mmol/L (ref 3.5–5.1)
Sodium: 143 mmol/L (ref 135–145)
Sodium: 141 mmol/L (ref 135–145)

## 2019-12-22 LAB — HEMOGLOBIN A1C
Hgb A1c MFr Bld: 5.1 % (ref 4.8–5.6)
Mean Plasma Glucose: 100 mg/dL

## 2019-12-22 LAB — PREPARE RBC (CROSSMATCH)

## 2019-12-22 LAB — GLUCOSE, CAPILLARY
Glucose-Capillary: 111 mg/dL — ABNORMAL HIGH (ref 70–99)
Glucose-Capillary: 114 mg/dL — ABNORMAL HIGH (ref 70–99)
Glucose-Capillary: 121 mg/dL — ABNORMAL HIGH (ref 70–99)
Glucose-Capillary: 134 mg/dL — ABNORMAL HIGH (ref 70–99)
Glucose-Capillary: 77 mg/dL (ref 70–99)
Glucose-Capillary: 84 mg/dL (ref 70–99)

## 2019-12-22 LAB — CBC
HCT: 18.4 % — ABNORMAL LOW (ref 39.0–52.0)
HCT: 19.1 % — ABNORMAL LOW (ref 39.0–52.0)
Hemoglobin: 6.4 g/dL — CL (ref 13.0–17.0)
Hemoglobin: 6.6 g/dL — CL (ref 13.0–17.0)
MCH: 36.2 pg — ABNORMAL HIGH (ref 26.0–34.0)
MCH: 32.7 pg (ref 26.0–34.0)
MCHC: 34.8 g/dL (ref 30.0–36.0)
MCHC: 34.6 g/dL (ref 30.0–36.0)
MCV: 104 fL — ABNORMAL HIGH (ref 80.0–100.0)
MCV: 94.6 fL (ref 80.0–100.0)
Platelets: 90 10*3/uL — ABNORMAL LOW (ref 150–400)
Platelets: 77 10*3/uL — ABNORMAL LOW (ref 150–400)
RBC: 1.77 MIL/uL — ABNORMAL LOW (ref 4.22–5.81)
RBC: 2.02 MIL/uL — ABNORMAL LOW (ref 4.22–5.81)
RDW: 13.1 % (ref 11.5–15.5)
RDW: 21.2 % — ABNORMAL HIGH (ref 11.5–15.5)
WBC: 19.5 10*3/uL — ABNORMAL HIGH (ref 4.0–10.5)
WBC: 16.6 10*3/uL — ABNORMAL HIGH (ref 4.0–10.5)
nRBC: 0 % (ref 0.0–0.2)
nRBC: 0.1 % (ref 0.0–0.2)

## 2019-12-22 LAB — MAGNESIUM
Magnesium: 1.7 mg/dL (ref 1.7–2.4)
Magnesium: 2.4 mg/dL (ref 1.7–2.4)

## 2019-12-22 LAB — FIBRINOGEN: Fibrinogen: 255 mg/dL (ref 210–475)

## 2019-12-22 LAB — RESP PANEL BY RT PCR (RSV, FLU A&B, COVID)
Influenza A by PCR: NEGATIVE
Influenza B by PCR: NEGATIVE
Respiratory Syncytial Virus by PCR: NEGATIVE
SARS Coronavirus 2 by RT PCR: NEGATIVE

## 2019-12-22 LAB — PHOSPHORUS
Phosphorus: 3.8 mg/dL (ref 2.5–4.6)
Phosphorus: 3.9 mg/dL (ref 2.5–4.6)

## 2019-12-22 LAB — APTT: aPTT: >200 seconds (ref 24–36)

## 2019-12-22 MED ORDER — SODIUM CHLORIDE 0.9% IV SOLUTION: Freq: Once | INTRAVENOUS | Status: AC

## 2019-12-22 MED ORDER — MAGNESIUM SULFATE 2 GM/50ML IV SOLN
2.0000 g | Freq: Once | INTRAVENOUS | Status: AC
  Administered 2019-12-22: 2 g via INTRAVENOUS
  Filled 2019-12-22: qty 50

## 2019-12-22 MED ORDER — ASPIRIN 81 MG PO CHEW
81.0000 mg | CHEWABLE_TABLET | Freq: Every day | ORAL | Status: DC
  Administered 2019-12-23: 81 mg
  Filled 2019-12-22: qty 1

## 2019-12-22 MED ORDER — PIPERACILLIN-TAZOBACTAM 3.375 G IVPB 30 MIN
3.3750 g | Freq: Once | INTRAVENOUS | Status: AC
  Administered 2019-12-22: 3.375 g via INTRAVENOUS
  Filled 2019-12-22 (×2): qty 50

## 2019-12-22 MED ORDER — POTASSIUM CHLORIDE 10 MEQ/50ML IV SOLN
10.0000 meq | INTRAVENOUS | Status: AC
  Administered 2019-12-22 (×3): 10 meq via INTRAVENOUS
  Filled 2019-12-22 (×2): qty 50

## 2019-12-22 MED ORDER — PIPERACILLIN-TAZOBACTAM 3.375 G IVPB
3.3750 g | Freq: Three times a day (TID) | INTRAVENOUS | Status: DC
  Administered 2019-12-22 – 2019-12-24 (×5): 3.375 g via INTRAVENOUS
  Filled 2019-12-22 (×4): qty 50

## 2019-12-22 MED ORDER — POLYETHYLENE GLYCOL 3350 17 G PO PACK: 17.0000 g | PACK | Freq: Every day | ORAL | Status: DC

## 2019-12-22 MED ORDER — CLOPIDOGREL BISULFATE 75 MG PO TABS
75.0000 mg | ORAL_TABLET | Freq: Every day | ORAL | Status: DC
  Administered 2019-12-23: 75 mg
  Filled 2019-12-22: qty 1

## 2019-12-22 MED ORDER — SODIUM CHLORIDE 0.9 % IV SOLN: INTRAVENOUS | Status: DC

## 2019-12-22 MED ORDER — "THROMBI-PAD 3""X3"" EX PADS"
1.0000 | MEDICATED_PAD | Freq: Once | CUTANEOUS | Status: AC
  Administered 2019-12-22: 1 via TOPICAL
  Filled 2019-12-22 (×2): qty 1

## 2019-12-22 MED ORDER — "THROMBI-PAD 3""X3"" EX PADS"
1.0000 | MEDICATED_PAD | Freq: Once | CUTANEOUS | Status: AC
  Administered 2019-12-22: 1 via TOPICAL
  Filled 2019-12-22: qty 1

## 2019-12-22 MED ORDER — DOCUSATE SODIUM 50 MG/5ML PO LIQD
100.0000 mg | Freq: Two times a day (BID) | ORAL | Status: DC
  Administered 2019-12-22 – 2019-12-23 (×3): 100 mg
  Filled 2019-12-22 (×3): qty 10

## 2019-12-22 NOTE — Progress Notes (Signed)
eLink Physician-Brief Progress Note Patient Name: Franklin Flores DOB: 02-Feb-1951 MRN: [id number]   Date of Service  12/22/2019  HPI/Events of Note  Multiple issues: 1. Anemia - Hgb = 6.4 and 2. K+ = 3.6, Mg++ = 1.7 and Creatinine = 1.13.   eICU Interventions  Plan: 1. Transfuse 1 unit PRBC now. 2. Replace k+ and Mg++.     Intervention Category Major Interventions: Electrolyte abnormality - evaluation and management;Other:  Lenell Antu 12/22/2019, 6:29 AM

## 2019-12-22 NOTE — Progress Notes (Addendum)
ANTICOAGULATION CONSULT NOTE  Pharmacy Consult for Heparin Indication: IABP  No Known Allergies  Patient Measurements: Height: 5\' 10"  (177.8 cm) Weight: 61.1 kg (134 lb 11.2 oz) IBW/kg (Calculated) : 73   Vital Signs: Temp: 98.4 F (36.9 C) (06/23 1100) Temp Source: Core (06/23 1045) BP: 136/52 (06/23 1057) Pulse Rate: 81 (06/23 1100)  Labs: Recent Labs    12/26/2019 2048 12/26/19 2115 12/21/19 0106 12/21/19 0106 12/21/19 0313 12/21/19 0313 12/21/19 0602 12/21/19 1302 12/21/19 1438 12/22/19 0018 12/22/19 0451  HGB 13.1   < > 9.9*   < > 7.8*   < > 8.2*  --   --   --  6.4*  HCT 39.1   < > 29.2*   < > 23.0*  --  24.3*  --   --   --  18.4*  PLT 159   < > 182  --   --   --  140*  --   --   --  90*  APTT 41*  --   --   --   --   --   --   --   --   --   --   LABPROT 16.3*  --   --   --   --   --   --   --   --   --   --   INR 1.4*  --   --   --   --   --   --   --   --   --   --   HEPARINUNFRC  --   --   --   --   --   --   --   --  0.57 0.49 0.33  CREATININE 1.30*   < > 1.09  --   --    < > 1.32* 1.11  --   --  1.13  TROPONINIHS 728*  --  >27,000*  --   --   --   --   --   --   --   --    < > = values in this interval not displayed.    Estimated Creatinine Clearance: 54.8 mL/min (by C-G formula based on SCr of 1.13 mg/dL).   Assessment: 69 y.o. male admitted with VFib cardiac arrest, s/p PCI and now on IABP on heparin.  Plans noted for possible withdrawal of care  Heparin level 0.33 (therapeutic). No bleeding issues noted, however hgb down to 6.4 this am, plt count down to 90  Goal of Therapy:  Heparin level 0.2-0.5 units/mL Monitor platelets by anticoagulation protocol: Yes   Plan:  - Continue heparin at 550 units/hr - F/u daily heparin level  79 PharmD., BCPS Clinical Pharmacist 12/22/2019 11:24 AM

## 2019-12-22 NOTE — Progress Notes (Signed)
LTM EEG discontinued - no skin breakdown at unhook.   

## 2019-12-22 NOTE — Progress Notes (Addendum)
Subjective: After titrating propofol and adjusting AEDs, we were able to stop status epilepticus however patient continues to have frequent epileptiform discharges suggestive of severe anoxic brain injury.  Patient's wife and daughter were at bedside today.  ROS: Unable to obtain due to poor mental status  Examination  Vital signs in last 24 hours: Temp:  [95.5 F (35.3 C)-98.4 F (36.9 C)] 98.4 F (36.9 C) (06/23 1100) Pulse Rate:  [71-103] 81 (06/23 1100) Resp:  [18-28] 24 (06/23 1100) BP: (107-138)/(44-97) 136/52 (06/23 1057) SpO2:  [90 %-100 %] 97 % (06/23 1100) Arterial Line BP: (89-136)/(39-59) 136/53 (06/23 1100) FiO2 (%):  [40 %-70 %] 50 % (06/23 1057) Weight:  [61.1 kg] 61.1 kg (06/23 0530)  General: lying in bed, cachectic looking  CVS: pulse-normal rate and rhythm RS: breathing comfortably, intubated Extremities: normal, warm Neuro: Comatose, pupils equal round and sluggishly reacting, corneal reflex absent, gag reflex absent, does not withdraw to noxious stimuli in all extremities, had eyelid myotonia and rhythmic mouth/eye movement on stimulation   Basic Metabolic Panel: Recent Labs  Lab 12/15/2019 2048 12/16/2019 2048 12/01/2019 2115 12/04/2019 2154 12/21/19 0106 12/21/19 0106 12/21/19 0313 12/21/19 0602 12/21/19 1302 12/22/19 0451  NA 133*   < > 134*   < > 142  --  142 142 143 143  K 4.2   < > 4.1   < > 3.8  --  2.9* 2.5* 3.7 3.6  CL 103   < > 101  --  106  --   --  107 112* 110  CO2 15*  --   --   --  24  --   --  20* 17* 24  GLUCOSE 360*   < > 364*  --  363*  --   --  310* 226* 121*  BUN 13   < > 17  --  17  --   --  18 19 25*  CREATININE 1.30*   < > 0.90  --  1.09  --   --  1.32* 1.11 1.13  CALCIUM 7.8*  --   --    < > 6.6*   < >  --  7.1* 7.6* 6.7*  MG  --   --   --   --  2.8*  --   --  2.4  --  1.7  PHOS  --   --   --   --   --   --   --  2.6  --  3.8   < > = values in this interval not displayed.    CBC: Recent Labs  Lab 12/10/2019 2048 12/01/2019 2115  12/21/19 0009 12/21/19 0106 12/21/19 0313 12/21/19 0602 12/22/19 0451  WBC 15.3*  --   --  18.3*  --  20.9* 19.5*  NEUTROABS 11.3*  --   --   --   --   --   --   HGB 13.1   < > 11.2* 9.9* 7.8* 8.2* 6.4*  HCT 39.1   < > 33.0* 29.2* 23.0* 24.3* 18.4*  MCV 106.8*  --   --  104.7*  --  106.1* 104.0*  PLT 159  --   --  182  --  140* 90*   < > = values in this interval not displayed.     Coagulation Studies: Recent Labs    12/18/2019 2048  LABPROT 16.3*  INR 1.4*    Imaging No brain imaging overnight   ASSESSMENT AND PLAN: 69 year old male with history of  epilepsy which per wife was well controlled on Dilantin monotherapy.  Patient suffered a cardiac arrest and was noted to have generalized tonic-clonic seizure-like activity.  Concomitant EEG showed burst suppression pattern with epileptiform discharges as well as frequent seizures consistent with convulsive status epilepticus.  Convulsive status epilepticus ( resolved) History of epilepsy Suspected diffuse anoxic brain injury Cardiac arrest -LTM EEG initially showed convulsive status epilepticus which improved however EEG continues to show burst suppression with frequent highly epileptiform bursts most likely suggestive of diffuse anoxic/hypoxic brain injury  Recommendations -Discussed with patient's daughter and wife at bedside that at this point it is very likely that patient has suffered irreversible neurologic brain injury due to anoxia.  He is already on high dose of pressors and therefore it is difficult to continue to titrate sedation.  Even if I am able to achieve adequate burst suppression, the chances that patient will have a meaningful neurologic recovery is minimal to none.  Patient's wife became tearful.  Patient daughter expressed understanding and states she will discuss with patient's wife and rest of the family members regarding further goals of care. -Continue current dose of AEDs and sedation. -Ideally CT head or  MRI brain would be needed to look for anoxic/hypoxic brain injury.  This can be pursued if family wishes -Continue seizure precautions -Management of rest of the comorbidities per primary team  CRITICAL CARE Performed by: Lora Havens  Total critical care time: 35 minutes  Critical care time was exclusive of separately billable procedures and treating other patients.  Critical care was necessary to treat or prevent imminent or life-threatening deterioration.  Critical care was time spent personally by me on the following activities: development of treatment plan with patient and/or surrogate as well as nursing, discussions with consultants, evaluation of patient's response to treatment, examination of patient, obtaining history from patient or surrogate, ordering and performing treatments and interventions, ordering and review of laboratory studies, ordering and review of radiographic studies, pulse oximetry and re-evaluation of patient's condition.    Zeb Comfort Epilepsy [facility]

## 2019-12-22 NOTE — Progress Notes (Signed)
NAME:  Franklin Flores, MRN:  [id number], DOB:  [birth year], LOS: 2 ADMISSION DATE:  05-Jan-2020, CONSULTATION DATE: 01/05/2020 REFERRING MD: Eldridge Dace, CHIEF COMPLAINT: Status post cardiac arrest  Brief History   69 year old male who arrested at home  History of present illness   Patient is a 69 year old male with probable history of COPD seizure disorder for witnessed arrest at home.  No CPR was initiated until EMS arrived about 7 minutes after his initial arrest.  He was initially in torsades shocked received magnesium and epinephrine and round of being shocked twice more.  In the ER he was started on amiodarone found to have ST elevation in inferior leads with urgent cardiac catheterization performed. My evaluation at bedside the patient does have some spontaneous movement.  He is ventilated.  He is on Levophed 30 mics along with amiodarone, he is mechanically ventilated.  Balloon pump is in place.  Past Medical History   . Cataract   . COPD (chronic obstructive pulmonary disease) (HCC)   . Seizures (HCC)   . Smoker      Significant Hospital Events   Resuscitated emergently intubated 2020-01-05 Cardiac catheterization 05-Jan-2020  Consults:  PCCM  Procedures:  As above  Significant Diagnostic Tests:  Initial electrolytes are essentially normal slight elevation of LFTs  Micro Data:  NA  Antimicrobials:  NA  Interim history/subjective:  Remains in profound shock with cortical irritability on EEG.  Objective   Blood pressure 124/82, pulse 90, temperature 98.2 F (36.8 C), resp. rate (!) 24, height 5\' 10"  (1.778 m), weight 61.1 kg, SpO2 92 %. PAP: (27-42)/(16-26) 27/16 CVP:  [6 mmHg-8 mmHg] 6 mmHg CO:  [3.8 L/min-4.9 L/min] 4.9 L/min CI:  [2.3 L/min/m2-3 L/min/m2] 3 L/min/m2  Vent Mode: PRVC FiO2 (%):  [40 %-80 %] 50 % Set Rate:  [24 bmp] 24 bmp Vt Set:  [580 mL] 580 mL PEEP:  [5 cmH20] 5 cmH20 Plateau Pressure:  [14 cmH20-20 cmH20] 14 cmH20   Intake/Output Summary  (Last 24 hours) at 12/22/2019 0803 Last data filed at 12/22/2019 0700 Gross per 24 hour  Intake 7244.27 ml  Output 1353 ml  Net 5891.27 ml   Filed Weights   01/05/20 2056 2020-01-05 2334 12/22/19 0530  Weight: 52 kg 53.2 kg 61.1 kg    Examination: GEN: chronically ill man unresponsive HEENT: ETT in place, thick white secretions CV: Tachycardic, ext lukewarm PULM: Rhonci bilaterally, passive on vent GI: Soft, hypoactive BS EXT: Muscle wasting NEURO: GCS3 PSYCH: cannot assess SKIN: ashen   Resolved Hospital Problem list   NA  Assessment & Plan:  Status post cardiac arrest: Initial rhythm V. fib with prolonged time from initial arrest to CPR.  CT Head not done for some reason.  Had emergent PCI with stent to RCA.  Lingering profound shock and ongoing burst suppression with intermittent seizures despite aggressive management.  Discussed with daughter at bedside and given patient's desire for full functional recovery, we are going to move toward a more comfort-based approach.  She is reaching out to his wife. Patient is DNR.  Best practice:  Diet: N.p.o.  Pain/Anxiety/Delirium protocol (if indicated): See above VAP protocol (if indicated): Yes DVT prophylaxis: Yes GI prophylaxis: Pepcid Glucose control: Monitor Mobility: Bedrest Code Status: DNR Family Communication: calling family Disposition: ICU  The patient is critically ill with multiple organ systems failure and requires high complexity decision making for assessment and support, frequent evaluation and titration of therapies, application of advanced monitoring technologies and extensive interpretation of multiple databases. Critical  Care Time devoted to patient care services described in this note independent of APP/resident time (if applicable)  is 32 minutes.   Erskine Emery MD [facility] 12/22/2019 8:03 AM Personal pager: [phone] If unanswered, please page CCM On-call: [phone]

## 2019-12-22 NOTE — Progress Notes (Signed)
ANTICOAGULATION CONSULT NOTE  Pharmacy Consult for Heparin Indication: IABP  No Known Allergies  Patient Measurements: Height: 5\' 10"  (177.8 cm) Weight: 53.2 kg (117 lb 4.6 oz) IBW/kg (Calculated) : 73   Vital Signs: Temp: 96.8 F (36 C) (06/23 0000) Temp Source: Core (06/22 2000) BP: 138/90 (06/23 0000) Pulse Rate: 95 (06/23 0000)  Labs: Recent Labs    12/11/2019 2048 12/13/2019 2115 12/21/19 0106 12/21/19 0106 12/21/19 0313 12/21/19 0602 12/21/19 1302 12/21/19 1438 12/22/19 0018  HGB 13.1   < > 9.9*   < > 7.8* 8.2*  --   --   --   HCT 39.1   < > 29.2*  --  23.0* 24.3*  --   --   --   PLT 159  --  182  --   --  140*  --   --   --   APTT 41*  --   --   --   --   --   --   --   --   LABPROT 16.3*  --   --   --   --   --   --   --   --   INR 1.4*  --   --   --   --   --   --   --   --   HEPARINUNFRC  --   --   --   --   --   --   --  0.57 0.49  CREATININE 1.30*   < > 1.09  --   --  1.32* 1.11  --   --   TROPONINIHS 728*  --  >27,000*  --   --   --   --   --   --    < > = values in this interval not displayed.    Estimated Creatinine Clearance: 48.6 mL/min (by C-G formula based on SCr of 1.11 mg/dL).   Assessment: 69 y.o. male admitted with VFib cardiac arrest, s/p PCI and now on IABP on heparin.  Plans noted for possible withdrawal of care  Heparin level 0.49 (therapeutic). RN states still some oozing IABP site but just a small amount - stable from earlier.  Goal of Therapy:  Heparin level 0.2-0.5 units/mL Monitor platelets by anticoagulation protocol: Yes   Plan:  - Continue heparin at 550 units/hr - F/u daily heparin level  79, PharmD, BCPS Please see amion for complete clinical pharmacist phone list 12/22/2019 1:11 AM

## 2019-12-22 NOTE — Procedures (Signed)
Patient Name:Franklin Flores YHC:[id number] Epilepsy Attending:Legrande Hao Annabelle Harman Referring Physician/Provider:Paul Mikey Bussing, NP Duration:12/23/2019 [phone] to 12/23/2019 1200  Patient history:69 year old male with history of seizures status post cardiac arrest.EEG evaluate for seizures  Level of alertness:comatose  AEDs during EEG study:Phenytoin, Keppra, propofol, Versed  Technical aspects: This EEG study was done with scalp electrodes positioned according to the 10-20 International system of electrode placement. Electrical activity was acquired at a sampling rate of 500Hz  and reviewed with a high frequency filter of 70Hz  and a low frequency filter of 1Hz . EEG data were recorded continuously and digitally stored.   Description:EEG showed burst suppression pattern with highly epileptiform bursts.  EEG was not reactive to tactile and noxious stimulation.  ABNORMALITY -Burst suppression with highly epileptiform discharges  IMPRESSION: This studyshowed evidence of generalized epileptogenicity and profound diffuse encephalopathy nonspecific to etiology but could be related to diffuse anoxic/hypoxic brain injury.  [name] 

## 2019-12-22 NOTE — Procedures (Addendum)
Patient Name: Franklin Flores  MRN: [id number]  Epilepsy Attending: Charlsie Quest  Referring Physician/Provider: Joneen Roach, NP Duration: 12/22/2019 7681 to 12/23/2019 1572  Patient history: 69 year old male with history of seizures status post cardiac arrest.  EEG evaluate for seizures  Level of alertness: comatose  AEDs during EEG study: Phenytoin, Keppra, propofol, Versed  Technical aspects: This EEG study was done with scalp electrodes positioned according to the 10-20 International system of electrode placement. Electrical activity was acquired at a sampling rate of 500Hz  and reviewed with a high frequency filter of 70Hz  and a low frequency filter of 1Hz . EEG data were recorded continuously and digitally stored.   Description: EEG initially showed convulsive status epilepticus with frequent seizures during which patient was noted to have brief eye opening and at times subtle axial jerking. Concomitant eeg showed generalized spikes and wave at 8Hz  which evolve into 5-6hz  theta followed by 1.5-2Hz  delta spike and wave.  As sedation was titrated up, EEG showed burst suppression pattern with highly epileptiform bursts.  ABNORMALITY -Convulsive status epilepticus, generalized -Burst suppression with highly epileptiform discharges  IMPRESSION: This study  initially showed evidence of convulsive status epilepticus which improved after sedation was titrated up but continued to show evidence of generalized epileptogenicity and profound diffuse encephalopathy nonspecific to etiology but could be related to diffuse anoxic/hypoxic brain injury.

## 2019-12-22 NOTE — Progress Notes (Signed)
Pt's ring removed by daughter d/t swelling. Placed in a pink denture cup and at bedside.

## 2019-12-22 NOTE — Progress Notes (Addendum)
Advanced Heart Failure Rounding Note   Subjective:    69 y/o male with COPD, ongoing tobacco use, seizure d/o. Admitted last night with cardiac arrest with significant time prior to CPR. Intubated in field.   Cath last night with occluded RCA and minimal left-sided CAD. EF 35-45%.   Post-cath course complicated by profound shock with RV failure and acidosis with initial MV sat in ICU 33% and status epilepticus  Seen by Neuro yesterday and prognosis felt to be very poor. Propofol titrated.   Remains on max pressors including epi 20 and NE 60.   Unresponsive. Continues with seizure activity   Objective:   Weight Range:  Vital Signs:   Temp:  [95 F (35 C)-98.4 F (36.9 C)] 98.2 F (36.8 C) (06/23 0756) Pulse Rate:  [71-103] 90 (06/23 0756) Resp:  [18-28] 24 (06/23 0756) BP: (90-138)/(61-97) 124/82 (06/23 0756) SpO2:  [90 %-100 %] 92 % (06/23 0756) Arterial Line BP: (81-126)/(39-59) 90/39 (06/23 0700) FiO2 (%):  [40 %-80 %] 50 % (06/23 0756) Weight:  [61.1 kg] 61.1 kg (06/23 0530) Last BM Date:  (PTA)  Weight change: Filed Weights   12/01/2019 2056 12/14/2019 2334 12/22/19 0530  Weight: 52 kg 53.2 kg 61.1 kg    Intake/Output:   Intake/Output Summary (Last 24 hours) at 12/22/2019 0823 Last data filed at 12/22/2019 0700 Gross per 24 hour  Intake 7244.27 ml  Output 1353 ml  Net 5891.27 ml     Physical Exam: General:  Frail cachetic appearing. On vent. Unresponsive HEENT: normal + ETT Neck: supple. no JVD. Carotids 2+ bilat; no bruits. No lymphadenopathy or thryomegaly appreciated. Cor: PMI nondisplaced. Regular rate & rhythm. No rubs, gallops or murmurs. Lungs: clear Abdomen: soft, nontender, nondistended. No hepatosplenomegaly. No bruits or masses. Good bowel sounds. Extremities: no cyanosis, clubbing, rash, edema _ IABP  Neuro: unresponsive    Telemetry: sinus 80-90s Personally reviewed   Labs: Basic Metabolic Panel: Recent Labs  Lab 12/15/2019 2048  12/02/2019 2048 12/10/2019 2115 12/05/2019 2154 12/21/19 0106 12/21/19 0106 12/21/19 0313 12/21/19 0602 12/21/19 1302 12/22/19 0451  NA 133*   < > 134*   < > 142  --  142 142 143 143  K 4.2   < > 4.1   < > 3.8  --  2.9* 2.5* 3.7 3.6  CL 103   < > 101  --  106  --   --  107 112* 110  CO2 15*  --   --   --  24  --   --  20* 17* 24  GLUCOSE 360*   < > 364*  --  363*  --   --  310* 226* 121*  BUN 13   < > 17  --  17  --   --  18 19 25*  CREATININE 1.30*   < > 0.90  --  1.09  --   --  1.32* 1.11 1.13  CALCIUM 7.8*  --   --    < > 6.6*   < >  --  7.1* 7.6* 6.7*  MG  --   --   --   --  2.8*  --   --  2.4  --  1.7  PHOS  --   --   --   --   --   --   --  2.6  --  3.8   < > = values in this interval not displayed.    Liver Function Tests: Recent  Labs  Lab 12/19/2019 2048  AST 85*  ALT 49*  ALKPHOS 102  BILITOT 0.3  PROT 4.9*  ALBUMIN 2.8*   No results for input(s): LIPASE, AMYLASE in the last 168 hours. No results for input(s): AMMONIA in the last 168 hours.  CBC: Recent Labs  Lab 12/18/2019 2048 12/02/2019 2115 12/21/19 0009 12/21/19 0106 12/21/19 0313 12/21/19 0602 12/22/19 0451  WBC 15.3*  --   --  18.3*  --  20.9* 19.5*  NEUTROABS 11.3*  --   --   --   --   --   --   HGB 13.1   < > 11.2* 9.9* 7.8* 8.2* 6.4*  HCT 39.1   < > 33.0* 29.2* 23.0* 24.3* 18.4*  MCV 106.8*  --   --  104.7*  --  106.1* 104.0*  PLT 159  --   --  182  --  140* 90*   < > = values in this interval not displayed.    Cardiac Enzymes: No results for input(s): CKTOTAL, CKMB, CKMBINDEX, TROPONINI in the last 168 hours.  BNP: BNP (last 3 results) No results for input(s): BNP in the last 8760 hours.  ProBNP (last 3 results) No results for input(s): PROBNP in the last 8760 hours.    Other results:  Imaging: CARDIAC CATHETERIZATION  Addendum Date: 12/05/2019    Mid RCA lesion is 50% stenosed.  Prox RCA lesion is 100% stenosed.  A drug-eluting stent was successfully placed using a STENT RESOLUTE  ONYX 3.0X22.  Post intervention, there is a 0% residual stenosis.  The left ventricular ejection fraction is 35-45% by visual estimate.  There is moderate left ventricular systolic dysfunction.  LV end diastolic pressure is low.  There is no aortic valve stenosis.  Ao sat 100%, PA sat 51%, PA 32/12; mean PA pressure 21 mm Hg, mean PCWP 7 mm Hg; RA pressure 11; CO 2.67 L/min; CI 1.52  Successful IABP placement via right femoral artery.  Cardiogenic shock likely from RV failure in the setting of inferior MI.  Will aggressively hydrate given low LVEDP and RA pressure > PCWP/LVEDP.  Continue IV Amio.  IV Cangrelor bridge until OG tube placed and Brilinta can be given.  Continue Cangrelor 4 hours after the Brilinta load since he will be cooled. Prognosis will depend on neuro recovery.  I updated his daughter who was in the waiting room.   Result Date: 12/16/2019  Mid RCA lesion is 50% stenosed.  Prox RCA lesion is 100% stenosed.  A drug-eluting stent was successfully placed using a STENT RESOLUTE ONYX 3.0X22.  Post intervention, there is a 0% residual stenosis.  The left ventricular ejection fraction is 35-45% by visual estimate.  There is moderate left ventricular systolic dysfunction.  LV end diastolic pressure is low.  There is no aortic valve stenosis.  Ao sat 100%, PA sat 51%, PA 32/12; mean PA pressure 21 mm Hg, mean PCWP 7 mm Hg; RA pressure 11; CO 2.67 L/min; CI 1.52  Successful IABP placement via right femoral artery.  Cardiogenic shock likely from RV failure in the setting of inferior MI.  Will aggressively hydrate given low LVEDP and RA pressure > PCWP/LVEDP.  Continue IV Amio.  IV Cangrelor bridge until OG tube placed and Brilinta can be given.  Continue Cangrelor 4 hours after the Brilinta load since he will be cooled. Prognosis will depend on neuro recovery.    DG CHEST PORT 1 VIEW  Result Date: 12/21/2019 CLINICAL DATA:  Management of intra-aortic  balloon pump. EXAM: PORTABLE CHEST 1  VIEW COMPARISON:  Radiograph yesterday. FINDINGS: Endotracheal tube tip is at the level of the clavicular heads. Enteric tube in place, tip below the diaphragm, however the side-port in the region of the gastroesophageal junction. There is Swan-Ganz catheter from an inferior approach with tip in the region of the right pulmonary outflow tract. Intra-aortic balloon pump with radiopaque marker projecting over the aortic arch. Heart is normal in size. Diffuse interstitial thickening likely pulmonary edema superimposed on emphysema. There is no pneumothorax or confluent airspace disease. Trace fluid in the right minor fissure. IMPRESSION: 1. Endotracheal tube tip at the level of the clavicular heads. Enteric tube in place, tip below the diaphragm, however side-port in the region of the gastroesophageal junction. Recommend advancement of at least 3 cm for optimal placement. 2. Swan-Ganz catheter from an inferior approach with tip in the right pulmonary outflow tract. Intra-aortic balloon pump with radiopaque marker projecting over the aortic arch. 3. Interstitial thickening likely pulmonary edema superimposed on emphysema. Electronically Signed   By: Keith Rake M.D.   On: 12/21/2019 00:29   DG Chest Portable 1 View  Result Date: 12/04/2019 CLINICAL DATA:  Status post intubation. EXAM: PORTABLE CHEST 1 VIEW COMPARISON:  May 30, 2014. FINDINGS: The heart size and mediastinal contours are within normal limits. Endotracheal tube is seen in grossly good position. No pneumothorax or pleural effusion is noted. Hyperexpansion of the lungs is noted. Mild interstitial densities are noted throughout both lungs which may represent scarring, but acute superimposed edema or inflammation cannot be excluded. The visualized skeletal structures are unremarkable. IMPRESSION: Endotracheal tube in grossly good position. Hyperexpansion of the lungs. Mild interstitial densities are noted throughout both lungs which may represent  scarring, but acute superimposed edema or inflammation cannot be excluded. Electronically Signed   By: Marijo Conception M.D.   On: 12/26/2019 20:58   DG Abd Portable 1V  Result Date: 12/21/2019 CLINICAL DATA:  Orogastric tube placement EXAM: PORTABLE ABDOMEN - 1 VIEW COMPARISON:  None. FINDINGS: Side port of the orogastric tube is in the distal esophagus, just above the gastroesophageal junction. Recommend advancing by 7 cm. IMPRESSION: Side port of the orogastric tube in the distal esophagus. Recommend advancing by 7 cm to ensure intragastric positioning. Electronically Signed   By: Ulyses Jarred M.D.   On: 12/21/2019 00:17   EEG adult  Result Date: 12/21/2019 Lora Havens, MD     12/21/2019 10:22 AM .Patient Name: Franklin Flores MRN: [id number] Epilepsy Attending: Lora Havens Referring Physician/Provider: Dr. Ina Homes Date: 12/21/2019 Duration: 25.21 minutes Patient history: 69 year old male with history of seizures status post cardiac arrest.  EEG evaluate for seizures Level of alertness: comatose AEDs during EEG study: Phenytoin, Keppra, propofol, Versed Technical aspects: This EEG study was done with scalp electrodes positioned according to the 10-20 International system of electrode placement. Electrical activity was acquired at a sampling rate of _0  and reviewed with a high frequency filter of _1  and a low frequency filter of _2 . EEG data were recorded continuously and digitally stored. Description: EEG showed burst suppression pattern with generalized EEG suppression lasting 6 to 10 seconds with generalized bursts of highly epileptiform discharges lasting 2 to 5 seconds.  EEG was not reactive to tactile stimulation.  Hyperventilation and photic stimulation were not performed.   ABNORMALITY -Burst suppression with highly epileptiform discharges IMPRESSION: This study showed evidence of generalized epileptogenic city and profound diffuse encephalopathy nonspecific to etiology but could be  related  to diffuse anoxic/hypoxic brain injury. Lora Havens   ECHOCARDIOGRAM COMPLETE  Result Date: 12/21/2019    ECHOCARDIOGRAM REPORT   Patient Name:   Franklin Flores Date of Exam: 12/21/2019 Medical Rec #:  [id number]    Height:       70.0 in Accession #:    [id number]   Weight:       117.3 lb Date of Birth:  04/16/1951    BSA:          1.664 m Patient Age:    69 years     BP:           90/64 mmHg Patient Gender: M            HR:           112 bpm. Exam Location:  Inpatient Procedure: 2D Echo, Cardiac Doppler, Color Doppler and Intracardiac            Opacification Agent STAT ECHO Indications:    Cardiac arrest I46.9  History:        Patient has no prior history of Echocardiogram examinations.                 COPD, Arrythmias:Cardiac Arrest and STEMI; Risk Factors:Current                 Smoker.  Sonographer:    Vickie Epley RDCS Referring Phys: [id number] Candee Furbish  Sonographer Comments: Echo performed with patient supine and on artificial respirator. IMPRESSIONS  1. The inferior wall is not adequately visualized to comment on wall motion abnormality.     .Left ventricular ejection fraction, by estimation, is 60 to 65%. The left ventricle has normal function. The left ventricle has no regional wall motion abnormalities. Left ventricular diastolic function could not be evaluated. Abnormal (paradoxical)  septal motion, consistent with RV pacemaker.  2. Right ventricular systolic function is severely reduced. The right ventricular size is severely enlarged. There is normal pulmonary artery systolic pressure. The estimated right ventricular systolic pressure is 88.9 mmHg.  3. The mitral valve is normal in structure. No evidence of mitral valve regurgitation. No evidence of mitral stenosis.  4. The aortic valve has an indeterminant number of cusps. Aortic valve regurgitation is not visualized. Mild aortic valve sclerosis is present, with no evidence of aortic valve stenosis.  5. The inferior vena cava is normal  in size with greater than 50% respiratory variability, suggesting right atrial pressure of 3 mmHg. FINDINGS  Left Ventricle: The inferior wall is not adequately visualized to comment on wall motion abnormality. Left ventricular ejection fraction, by estimation, is 60 to 65%. The left ventricle has normal function. The left ventricle has no regional wall motion abnormalities. Definity contrast agent was given IV to delineate the left ventricular endocardial borders. The left ventricular internal cavity size was normal in size. There is no left ventricular hypertrophy. Abnormal (paradoxical) septal motion, consistent with RV pacemaker. Left ventricular diastolic function could not be evaluated. Right Ventricle: The right ventricular size is severely enlarged. No increase in right ventricular wall thickness. Right ventricular systolic function is severely reduced. There is normal pulmonary artery systolic pressure. The tricuspid regurgitant velocity is 2.04 m/s, and with an assumed right atrial pressure of 3 mmHg, the estimated right ventricular systolic pressure is 16.9 mmHg. Left Atrium: Left atrial size was normal in size. Right Atrium: Right atrial size was not well visualized. Pericardium: There is no evidence of pericardial effusion. Mitral Valve: The mitral valve is normal  in structure. Normal mobility of the mitral valve leaflets. No evidence of mitral valve regurgitation. No evidence of mitral valve stenosis. Tricuspid Valve: The tricuspid valve is normal in structure. Tricuspid valve regurgitation is mild . No evidence of tricuspid stenosis. Aortic Valve: The aortic valve has an indeterminant number of cusps. Aortic valve regurgitation is not visualized. Mild aortic valve sclerosis is present, with no evidence of aortic valve stenosis. Pulmonic Valve: The pulmonic valve was normal in structure. Pulmonic valve regurgitation is not visualized. No evidence of pulmonic stenosis. Aorta: The aortic root is normal  in size and structure. Venous: The inferior vena cava is normal in size with greater than 50% respiratory variability, suggesting right atrial pressure of 3 mmHg. IAS/Shunts: No atrial level shunt detected by color flow Doppler. Additional Comments: A pacer wire is visualized.  LEFT VENTRICLE PLAX 2D LVIDd:         3.60 cm LVIDs:         2.80 cm LV PW:         0.70 cm LV IVS:        0.70 cm LVOT diam:     1.70 cm LV SV:         23 LV SV Index:   14 LVOT Area:     2.27 cm  LV Volumes (MOD) LV vol d, MOD A4C: 43.5 ml LV vol s, MOD A4C: 25.7 ml LV SV MOD A4C:     43.5 ml RIGHT VENTRICLE RV S prime:     7.53 cm/s TAPSE (M-mode): 1.6 cm LEFT ATRIUM           Index      RIGHT ATRIUM          Index LA diam:      2.20 cm 1.32 cm/m RA Area:     6.93 cm LA Vol (A4C): 15.0 ml 9.02 ml/m RA Volume:   11.20 ml 6.73 ml/m  AORTIC VALVE LVOT Vmax:   73.90 cm/s LVOT Vmean:  46.800 cm/s LVOT VTI:    0.103 m  AORTA Ao Asc diam: 3.20 cm MITRAL VALVE               TRICUSPID VALVE MV Area (PHT): 3.99 cm    TR Peak grad:   16.6 mmHg MV Decel Time: 190 msec    TR Vmax:        204.00 cm/s MV E velocity: 43.70 cm/s MV A velocity: 42.20 cm/s  SHUNTS MV E/A ratio:  1.04        Systemic VTI:  0.10 m                            Systemic Diam: 1.70 cm Fransico Him MD Electronically signed by Fransico Him MD Signature Date/Time: 12/21/2019/10:07:29 AM    Final      Medications:     Scheduled Medications: . sodium chloride   Intravenous Once  . aspirin  81 mg Oral Daily  . chlorhexidine gluconate (MEDLINE KIT)  15 mL Mouth Rinse BID  . Chlorhexidine Gluconate Cloth  6 each Topical Daily  . clopidogrel  75 mg Oral Daily  . docusate  100 mg Oral BID  . fentaNYL (SUBLIMAZE) injection  25 mcg Intravenous Once  . insulin aspart  0-9 Units Subcutaneous Q4H  . insulin detemir  10 Units Subcutaneous BID  . mouth rinse  15 mL Mouth Rinse 10 times per day  . pantoprazole (PROTONIX) IV  40 mg Intravenous Daily  . phenytoin  100 mg Per  Tube TID  . polyethylene glycol  17 g Oral Daily  . sodium chloride flush  10-40 mL Intracatheter Q12H  . sodium chloride flush  3 mL Intravenous Q12H    Infusions: . sodium chloride 10 mL/hr at 12/22/19 0700  . sodium chloride    . sodium chloride 10 mL/hr at 12/21/19 0129  . amiodarone 30 mg/hr (12/22/19 0700)  . epinephrine 20 mcg/min (12/22/19 0820)  . fentaNYL infusion INTRAVENOUS Stopped (12/21/19 0202)  . heparin 550 Units/hr (12/22/19 0700)  . levETIRAcetam 2,000 mg (12/22/19 0821)  . magnesium sulfate bolus IVPB    . midazolam 5 mg/hr (12/22/19 0700)  . milrinone 0.25 mcg/kg/min (12/22/19 0700)  . norepinephrine (LEVOPHED) Adult infusion 50 mcg/min (12/22/19 0716)  . potassium chloride 10 mEq (12/22/19 0715)  . propofol (DIPRIVAN) infusion 60 mcg/kg/min (12/22/19 0700)  .  sodium bicarbonate (isotonic) infusion in sterile water 50 mL/hr at 12/22/19 0700  . vasopressin (PITRESSIN) infusion - *FOR SHOCK* 0.04 Units/min (12/22/19 0700)    PRN Medications: sodium chloride, acetaminophen, fentaNYL, ipratropium-albuterol, midazolam, ondansetron (ZOFRAN) IV, ondansetron (ZOFRAN) IV, sodium chloride flush, sodium chloride flush   Assessment/Plan:   1. VT/VF cardiac arrest 2. 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 Acute hypoxic respiratory failure  5. Severe anoxic brain injury  From cardiac perspective hemodynamics improved with high-dose pressors and IABP. Unfortunately has severe anoxic brain injury with ongoing seizure activity. Prognosis very poor.   Family has decided to proceed with transition to comfort care later today.   Will turn IABP off when support being withdrawn. (can pull device post-mortem)  CRITICAL CARE Performed by: Glori Bickers  Total critical care time: 35 minutes  Critical care time was exclusive of separately billable procedures and treating other patients.  Critical care was necessary  to treat or prevent imminent or life-threatening deterioration.  Critical care was time spent personally by me (independent of midlevel providers or residents) on the following activities: development of treatment plan with patient and/or surrogate as well as nursing, discussions with consultants, evaluation of patient's response to treatment, examination of patient, obtaining history from patient or surrogate, ordering and performing treatments and interventions, ordering and review of laboratory studies, ordering and review of radiographic studies, pulse oximetry and re-evaluation of patient's condition.    Glori Bickers, MD  8:23 AM  Advanced Heart Failure Team Pager [phone] (M-F; 7a - 4p)  Please contact [facility] for night-coverage after hours (4p -7a ) and weekends on [email or web address]

## 2019-12-23 DIAGNOSIS — G931 Anoxic brain damage, not elsewhere classified: Secondary | ICD-10-CM

## 2019-12-23 LAB — URINALYSIS, COMPLETE (UACMP) WITH MICROSCOPIC
Bacteria, UA: NONE SEEN
Bilirubin Urine: NEGATIVE
Glucose, UA: NEGATIVE mg/dL
Ketones, ur: NEGATIVE mg/dL
Leukocytes,Ua: NEGATIVE
Nitrite: NEGATIVE
Protein, ur: NEGATIVE mg/dL
Specific Gravity, Urine: 1.015 (ref 1.005–1.030)
pH: 5 (ref 5.0–8.0)

## 2019-12-23 LAB — CBC
HCT: 16.3 % — ABNORMAL LOW (ref 39.0–52.0)
HCT: 19.8 % — ABNORMAL LOW (ref 39.0–52.0)
HCT: 20.8 % — ABNORMAL LOW (ref 39.0–52.0)
Hemoglobin: 5.7 g/dL — CL (ref 13.0–17.0)
Hemoglobin: 7 g/dL — ABNORMAL LOW (ref 13.0–17.0)
Hemoglobin: 7.2 g/dL — ABNORMAL LOW (ref 13.0–17.0)
MCH: 31.6 pg (ref 26.0–34.0)
MCH: 32.2 pg (ref 26.0–34.0)
MCH: 32.6 pg (ref 26.0–34.0)
MCHC: 34.6 g/dL (ref 30.0–36.0)
MCHC: 35 g/dL (ref 30.0–36.0)
MCHC: 35.4 g/dL (ref 30.0–36.0)
MCV: 91.2 fL (ref 80.0–100.0)
MCV: 92.1 fL (ref 80.0–100.0)
MCV: 92.1 fL (ref 80.0–100.0)
Platelets: 52 10*3/uL — ABNORMAL LOW (ref 150–400)
Platelets: 59 10*3/uL — ABNORMAL LOW (ref 150–400)
Platelets: 60 10*3/uL — ABNORMAL LOW (ref 150–400)
RBC: 1.77 MIL/uL — ABNORMAL LOW (ref 4.22–5.81)
RBC: 2.15 MIL/uL — ABNORMAL LOW (ref 4.22–5.81)
RBC: 2.28 MIL/uL — ABNORMAL LOW (ref 4.22–5.81)
RDW: 19 % — ABNORMAL HIGH (ref 11.5–15.5)
RDW: 19.3 % — ABNORMAL HIGH (ref 11.5–15.5)
RDW: 19.5 % — ABNORMAL HIGH (ref 11.5–15.5)
WBC: 13.5 10*3/uL — ABNORMAL HIGH (ref 4.0–10.5)
WBC: 13.6 10*3/uL — ABNORMAL HIGH (ref 4.0–10.5)
WBC: 14.6 10*3/uL — ABNORMAL HIGH (ref 4.0–10.5)
nRBC: 0.2 % (ref 0.0–0.2)
nRBC: 0.3 % — ABNORMAL HIGH (ref 0.0–0.2)
nRBC: 0.5 % — ABNORMAL HIGH (ref 0.0–0.2)

## 2019-12-23 LAB — PHOSPHORUS: Phosphorus: 4.6 mg/dL (ref 2.5–4.6)

## 2019-12-23 LAB — GLUCOSE, CAPILLARY
Glucose-Capillary: 69 mg/dL — ABNORMAL LOW (ref 70–99)
Glucose-Capillary: 75 mg/dL (ref 70–99)
Glucose-Capillary: 76 mg/dL (ref 70–99)
Glucose-Capillary: 78 mg/dL (ref 70–99)
Glucose-Capillary: 89 mg/dL (ref 70–99)
Glucose-Capillary: 94 mg/dL (ref 70–99)
Glucose-Capillary: 97 mg/dL (ref 70–99)

## 2019-12-23 LAB — COMPREHENSIVE METABOLIC PANEL
ALT: 99 U/L — ABNORMAL HIGH (ref 0–44)
ALT: 95 U/L — ABNORMAL HIGH (ref 0–44)
AST: 197 U/L — ABNORMAL HIGH (ref 15–41)
AST: 179 U/L — ABNORMAL HIGH (ref 15–41)
Albumin: 1.6 g/dL — ABNORMAL LOW (ref 3.5–5.0)
Albumin: 1.4 g/dL — ABNORMAL LOW (ref 3.5–5.0)
Alkaline Phosphatase: 53 U/L (ref 38–126)
Alkaline Phosphatase: 49 U/L (ref 38–126)
Anion gap: 9 (ref 5–15)
Anion gap: 6 (ref 5–15)
BUN: 29 mg/dL — ABNORMAL HIGH (ref 8–23)
BUN: 35 mg/dL — ABNORMAL HIGH (ref 8–23)
CO2: 23 mmol/L (ref 22–32)
CO2: 25 mmol/L (ref 22–32)
Calcium: 5.9 mg/dL — CL (ref 8.9–10.3)
Calcium: 5.8 mg/dL — CL (ref 8.9–10.3)
Chloride: 109 mmol/L (ref 98–111)
Chloride: 109 mmol/L (ref 98–111)
Creatinine, Ser: 1.2 mg/dL (ref 0.61–1.24)
Creatinine, Ser: 1.36 mg/dL — ABNORMAL HIGH (ref 0.61–1.24)
GFR calc Af Amer: >60 mL/min (ref >60)
GFR calc Af Amer: >60 mL/min (ref >60)
GFR calc non Af Amer: >60 mL/min (ref >60)
GFR calc non Af Amer: 53 mL/min — ABNORMAL LOW (ref >60)
Glucose, Bld: 105 mg/dL — ABNORMAL HIGH (ref 70–99)
Glucose, Bld: 96 mg/dL (ref 70–99)
Potassium: 4 mmol/L (ref 3.5–5.1)
Potassium: 3.8 mmol/L (ref 3.5–5.1)
Sodium: 141 mmol/L (ref 135–145)
Sodium: 140 mmol/L (ref 135–145)
Total Bilirubin: 1.1 mg/dL (ref 0.3–1.2)
Total Bilirubin: 1.3 mg/dL — ABNORMAL HIGH (ref 0.3–1.2)
Total Protein: <3 g/dL — ABNORMAL LOW (ref 6.5–8.1)
Total Protein: <3 g/dL — ABNORMAL LOW (ref 6.5–8.1)

## 2019-12-23 LAB — PROTIME-INR
INR: 1.9 — ABNORMAL HIGH (ref 0.8–1.2)
Prothrombin Time: 21.5 seconds — ABNORMAL HIGH (ref 11.4–15.2)

## 2019-12-23 LAB — DIFFERENTIAL
Abs Immature Granulocytes: 0.11 10*3/uL — ABNORMAL HIGH (ref 0.00–0.07)
Basophils Absolute: 0 10*3/uL (ref 0.0–0.1)
Basophils Relative: 0 %
Eosinophils Absolute: 0 10*3/uL (ref 0.0–0.5)
Eosinophils Relative: 0 %
Immature Granulocytes: 1 %
Lymphocytes Relative: 12 %
Lymphs Abs: 1.6 10*3/uL (ref 0.7–4.0)
Monocytes Absolute: 0.7 10*3/uL (ref 0.1–1.0)
Monocytes Relative: 5 %
Neutro Abs: 11.2 10*3/uL — ABNORMAL HIGH (ref 1.7–7.7)
Neutrophils Relative %: 82 %

## 2019-12-23 LAB — APTT: aPTT: >200 seconds (ref 24–36)

## 2019-12-23 LAB — URINE CULTURE: Culture: NO GROWTH

## 2019-12-23 LAB — MAGNESIUM: Magnesium: 2.1 mg/dL (ref 1.7–2.4)

## 2019-12-23 LAB — BILIRUBIN, DIRECT: Bilirubin, Direct: 0.6 mg/dL — ABNORMAL HIGH (ref 0.0–0.2)

## 2019-12-23 LAB — HEPARIN LEVEL (UNFRACTIONATED): Heparin Unfractionated: 0.2 IU/mL — ABNORMAL LOW (ref 0.30–0.70)

## 2019-12-23 MED ORDER — SODIUM CHLORIDE 0.9 % IV SOLN: INTRAVENOUS | Status: DC | PRN

## 2019-12-23 MED ORDER — CHLORHEXIDINE GLUCONATE 0.12 % MT SOLN
OROMUCOSAL | Status: AC
  Filled 2019-12-23: qty 15

## 2019-12-23 MED ORDER — SODIUM CHLORIDE 0.9% IV SOLUTION: Freq: Once | INTRAVENOUS | Status: AC

## 2019-12-23 MED ORDER — "THROMBI-PAD 3""X3"" EX PADS"
1.0000 | MEDICATED_PAD | Freq: Once | CUTANEOUS | Status: DC
  Filled 2019-12-23: qty 1

## 2019-12-23 MED ORDER — FUROSEMIDE 10 MG/ML IJ SOLN
20.0000 mg | Freq: Once | INTRAMUSCULAR | Status: AC
  Administered 2019-12-23: 20 mg via INTRAVENOUS
  Filled 2019-12-23: qty 2

## 2019-12-23 NOTE — Progress Notes (Signed)
ANTICOAGULATION CONSULT NOTE  Pharmacy Consult for Heparin Indication: IABP  No Known Allergies  Patient Measurements: Height: 5\' 10"  (177.8 cm) Weight: 67.2 kg (148 lb 2.4 oz) IBW/kg (Calculated) : 73   Vital Signs: Temp: 97.9 F (36.6 C) (06/24 0800) BP: 89/60 (06/24 0721) Pulse Rate: 80 (06/24 0800)  Labs: Recent Labs    Jan 03, 2020 2048 01/03/2020 2115 12/21/19 0106 12/21/19 0313 12/21/19 0602 12/22/19 0018 12/22/19 0451 12/22/19 0451 12/22/19 1547 12/22/19 1547 12/23/19 0015 12/23/19 0321  HGB 13.1   < > 9.9*   < >   < >  --  6.4*   < > 6.6*   < > 7.2* 7.0*  HCT 39.1   < > 29.2*   < >   < >  --  18.4*   < > 19.1*  --  20.8* 19.8*  PLT 159   < > 182   < >   < >  --  90*   < > 77*  --  60* 59*  APTT 41*  --   --   --   --   --   --   --  >200*  --   --  >200*  LABPROT 16.3*  --   --   --   --   --   --   --  20.0*  --   --  21.5*  INR 1.4*  --   --   --   --   --   --   --  1.8*  --   --  1.9*  HEPARINUNFRC  --   --   --    < >  --  0.49 0.33  --   --   --   --  0.20*  CREATININE 1.30*   < > 1.09   < >   < >  --  1.13  --  1.11  --   --  1.20  TROPONINIHS 728*  --  >27,000*  --   --   --   --   --   --   --   --   --    < > = values in this interval not displayed.    Estimated Creatinine Clearance: 56.8 mL/min (by C-G formula based on SCr of 1.2 mg/dL).   Assessment: 69 y.o. male admitted with VFib cardiac arrest, s/p PCI and now on IABP on heparin.  Plans noted for possible withdrawal of care  Heparin level 0.20 (therapeutic). No bleeding issues noted, hgb up to 7 this am. Donor services workup underway. Will continue current care.   Goal of Therapy:  Heparin level 0.2-0.5 units/mL Monitor platelets by anticoagulation protocol: Yes   Plan:  - Continue heparin at 550 units/hr - F/u daily heparin level  79 PharmD., BCPS Clinical Pharmacist 12/23/2019 9:32 AM

## 2019-12-23 NOTE — Progress Notes (Addendum)
NAME:  Jrue Jarriel, MRN:  [id number], DOB:  Mar 21, 1951, LOS: 3 ADMISSION DATE:  01-Jan-2020, CONSULTATION DATE: 01/01/2020 REFERRING MD: Eldridge Dace, CHIEF COMPLAINT: Status post cardiac arrest  Brief History   69 year old male who arrested at home  History of present illness   Patient is a 69 year old male with probable history of COPD seizure disorder for witnessed arrest at home.  No CPR was initiated until EMS arrived about 7 minutes after his initial arrest.  He was initially in torsades shocked received magnesium and epinephrine and round of being shocked twice more.  In the ER he was started on amiodarone found to have ST elevation in inferior leads with urgent cardiac catheterization performed. My evaluation at bedside the patient does have some spontaneous movement.  He is ventilated.  He is on Levophed 30 mics along with amiodarone, he is mechanically ventilated.  Balloon pump is in place.  Past Medical History   . Cataract   . COPD (chronic obstructive pulmonary disease) (HCC)   . Seizures (HCC)   . Smoker      Significant Hospital Events   Resuscitated emergently intubated 01/01/2020 Cardiac catheterization 2020-01-01  Consults:  PCCM  Procedures:  As above  Significant Diagnostic Tests:  Initial electrolytes are essentially normal slight elevation of LFTs  Micro Data:  NA  Antimicrobials:  NA  Interim history/subjective:  Remains in profound shock with cortical irritability on EEG.  Objective   Blood pressure (!) 90/48, pulse 92, temperature 97.7 F (36.5 C), resp. rate (!) 24, height 5\' 10"  (1.778 m), weight 67.2 kg, SpO2 98 %. PAP: (24-36)/(14-26) 24/15  Vent Mode: PRVC FiO2 (%):  [50 %-100 %] 100 % Set Rate:  [24 bmp] 24 bmp Vt Set:  [580 mL] 580 mL PEEP:  [5 cmH20] 5 cmH20 Plateau Pressure:  [14 cmH20-20 cmH20] 20 cmH20   Intake/Output Summary (Last 24 hours) at 12/23/2019 0759 Last data filed at 12/23/2019 0700 Gross per 24 hour  Intake 8962.36 ml   Output 650 ml  Net 8312.36 ml   Filed Weights   January 01, 2020 2334 12/22/19 0530 12/23/19 0515  Weight: 53.2 kg 61.1 kg 67.2 kg    Examination: GEN: chronically ill man unresponsive HEENT: ETT in place, thick white secretions CV: Tachycardic, ext lukewarm PULM: Rhonci bilaterally, passive on vent GI: Soft, hypoactive BS EXT: Muscle wasting NEURO: no response to pain or voice, pupils are reactive, he has no corneals, no oculocephalic reflex, no cough/gag, he does trigger vent and does do flexion respons with upper ext to deep suctioning PSYCH: cannot assess SKIN: ashen   Resolved Hospital Problem list   NA  Assessment & Plan:  Status post cardiac arrest: Initial rhythm V. fib with prolonged time from initial arrest to CPR.  CT Head not done for some reason.  Had emergent PCI with stent to RCA.  Lingering profound shock and ongoing burst suppression with intermittent seizures despite aggressive management.  Donor services evaluation underway  Continue aggressive care including IABP in interim  Best practice:  Diet: N.p.o.  Pain/Anxiety/Delirium protocol (if indicated): See above VAP protocol (if indicated): Yes DVT prophylaxis: heparin gtt GI prophylaxis: Pepcid Glucose control: Monitor Mobility: Bedrest Code Status: DNR Family Communication: updated daily as they come to bedside Disposition: ICU  The patient is critically ill with multiple organ systems failure and requires high complexity decision making for assessment and support, frequent evaluation and titration of therapies, application of advanced monitoring technologies and extensive interpretation of multiple databases. Critical Care Time devoted to  patient care services described in this note independent of APP/resident time (if applicable)  is 35 minutes.   Erskine Emery MD [facility] 12/23/2019 7:59 AM Personal pager: [phone] If unanswered, please page CCM On-call: [phone]

## 2019-12-23 NOTE — Progress Notes (Signed)
   12/23/19 1800  Clinical Encounter Type  Visited With Patient and family together  Visit Type Follow-up   Chaplain engaged in follow-up visit with Terri's daughter.  Daughter shared with chaplain the relationship she had with father and step-mother, and how proud that her dad is going to be an organ donor.  Chaplain offered the ministries of presence, listening and support.   Chaplain will follow-up.

## 2019-12-23 NOTE — Progress Notes (Signed)
eLink Physician-Brief Progress Note Patient Name: Franklin Flores DOB: [birth year] MRN: [id number]   Date of Service  12/23/2019  HPI/Events of Note  Corrected calcium 7.8 gm  eICU Interventions  Calcium gluconate 2  Gm iv x 1        Reedy Biernat U Ricarda Atayde 12/23/2019, 5:46 AM

## 2019-12-23 NOTE — Progress Notes (Signed)
Advanced Heart Failure Rounding Note   Subjective:    Remains on max dose pressors and IABP. Swan numbers stable but now with marked fluid overload.   Continues with seizure activity.   Family has decided to transition to comfort care with possible organ donation. CDS involved.   Objective:   Weight Range:  Vital Signs:   Temp:  [96.6 F (35.9 C)-98.6 F (37 C)] 98.2 F (36.8 C) (06/24 1115) Pulse Rate:  [80-97] 84 (06/24 1115) Resp:  [22-27] 24 (06/24 1115) BP: (81-137)/(43-95) 89/60 (06/24 0721) SpO2:  [89 %-99 %] 99 % (06/24 1115) Arterial Line BP: (70-140)/(40-64) 116/64 (06/24 1115) FiO2 (%):  [50 %-100 %] 100 % (06/24 1100) Weight:  [67.2 kg] 67.2 kg (06/24 0515) Last BM Date:  (PTA)  Weight change: Filed Weights   12/21/2019 2334 12/22/19 0530 12/23/19 0515  Weight: 53.2 kg 61.1 kg 67.2 kg    Intake/Output:   Intake/Output Summary (Last 24 hours) at 12/23/2019 1137 Last data filed at 12/23/2019 1101 Gross per 24 hour  Intake 8794.98 ml  Output 790 ml  Net 8004.98 ml     Physical Exam: General:  Frail cachetic appearing. On vent. Unresponsive HEENT: normal + ETT Neck: supple. JVP to ear Carotids 2+ bilat; no bruits. No lymphadenopathy or thryomegaly appreciated. Cor: PMI nondisplaced. Regular rate & rhythm. 2/6 SEM LUSB Lungs:coarse Abdomen: soft, nontender, +distended. No hepatosplenomegaly. No bruits or masses. Good bowel sounds. Extremities: no cyanosis, clubbing, rash, 3-4+ edema Neuro: unresponsive. Intermittent seizure activity   Telemetry: sinus 80-90ss Personally reviewed   Labs: Basic Metabolic Panel: Recent Labs  Lab 12/21/19 0106 12/21/19 0313 12/21/19 0602 12/21/19 0602 12/21/19 1302 12/21/19 1302 12/22/19 0451 12/22/19 1547 12/23/19 0321  NA 142   < > 142  --  143  --  143 141 141  K 3.8   < > 2.5*  --  3.7  --  3.6 4.1 4.0  CL 106   < > 107  --  112*  --  110 110 109  CO2 24   < > 20*  --  17*  --  _0 GLUCOSE 363*    < > 310*  --  226*  --  121* 116* 105*  BUN 17   < > 18  --  19  --  25* 29* 29*  CREATININE 1.09   < > 1.32*  --  1.11  --  1.13 1.11 1.20  CALCIUM 6.6*   < > 7.1*   < > 7.6*   < > 6.7* 6.2* 5.9*  MG 2.8*  --  2.4  --   --   --  1.7 2.4 2.1  PHOS  --   --  2.6  --   --   --  3.8 3.9 4.6   < > = values in this interval not displayed.    Liver Function Tests: Recent Labs  Lab 12/22/2019 2048 12/23/19 0321  AST 85* 197*  ALT 49* 99*  ALKPHOS 102 53  BILITOT 0.3 1.1  PROT 4.9* <3.0*  ALBUMIN 2.8* 1.6*   No results for input(s): LIPASE, AMYLASE in the last 168 hours. No results for input(s): AMMONIA in the last 168 hours.  CBC: Recent Labs  Lab 12/13/2019 2048 12/03/2019 2115 12/21/19 0602 12/22/19 0451 12/22/19 1547 12/23/19 0015 12/23/19 0321  WBC 15.3*   < > 20.9* 19.5* 16.6* 13.5* 13.6*  NEUTROABS 11.3*  --   --   --   --   --  11.2*  HGB 13.1   < > 8.2* 6.4* 6.6* 7.2* 7.0*  HCT 39.1   < > 24.3* 18.4* 19.1* 20.8* 19.8*  MCV 106.8*   < > 106.1* 104.0* 94.6 91.2 92.1  PLT 159   < > 140* 90* 77* 60* 59*   < > = values in this interval not displayed.    Cardiac Enzymes: No results for input(s): CKTOTAL, CKMB, CKMBINDEX, TROPONINI in the last 168 hours.  BNP: BNP (last 3 results) No results for input(s): BNP in the last 8760 hours.  ProBNP (last 3 results) No results for input(s): PROBNP in the last 8760 hours.    Other results:  Imaging: Overnight EEG with video  Result Date: 12/22/2019 Lora Havens, MD     12/22/2019  2:01 PM Patient Name: Franklin Flores MRN: [id number] Epilepsy Attending: Lora Havens Referring Physician/Provider: Georgann Housekeeper, NP Duration: 12/22/2019 6010 to 12/23/2019 9323  Patient history: 69 year old male with history of seizures status post cardiac arrest.  EEG evaluate for seizures  Level of alertness: comatose  AEDs during EEG study: Phenytoin, Keppra, propofol, Versed  Technical aspects: This EEG study was done with scalp  electrodes positioned according to the 10-20 International system of electrode placement. Electrical activity was acquired at a sampling rate of 500Hz and reviewed with a high frequency filter of 70Hz and a low frequency filter of 1Hz. EEG data were recorded continuously and digitally stored. Description: EEG initially showed convulsive status epilepticus with frequent seizures during which patient was noted to have brief eye opening and at times subtle axial jerking. Concomitant eeg showed generalized spikes and wave at [employer] which evolve into 5-6hz theta followed by 1.5-2Hz delta spike and wave.  As sedation was titrated up, EEG showed burst suppression pattern with highly epileptiform bursts. ABNORMALITY -Convulsive status epilepticus, generalized -Burst suppression with highly epileptiform discharges  IMPRESSION: This study  initially showed evidence of convulsive status epilepticus which improved after sedation was titrated up but continued to show evidence of generalized epileptogenicity and profound diffuse encephalopathy nonspecific to etiology but could be related to diffuse anoxic/hypoxic brain injury.     Medications:     Scheduled Medications: . aspirin  81 mg Per Tube Daily  . chlorhexidine gluconate (MEDLINE KIT)  15 mL Mouth Rinse BID  . Chlorhexidine Gluconate Cloth  6 each Topical Daily  . clopidogrel  75 mg Per Tube Daily  . docusate  100 mg Per Tube BID  . fentaNYL (SUBLIMAZE) injection  25 mcg Intravenous Once  . insulin aspart  0-9 Units Subcutaneous Q4H  . insulin detemir  10 Units Subcutaneous BID  . mouth rinse  15 mL Mouth Rinse 10 times per day  . pantoprazole (PROTONIX) IV  40 mg Intravenous Daily  . phenytoin  100 mg Per Tube TID  . polyethylene glycol  17 g Per Tube Daily  . sodium chloride flush  10-40 mL Intracatheter Q12H  . sodium chloride flush  3 mL Intravenous Q12H  . Thrombi-Pad  1 each Topical Once    Infusions: . sodium chloride Stopped (12/22/19 1752)    . sodium chloride    . sodium chloride 10 mL/hr at 12/21/19 0129  . sodium chloride 100 mL/hr at 12/23/19 1100  . amiodarone 30 mg/hr (12/23/19 1100)  . epinephrine 20 mcg/min (12/23/19 1100)  . fentaNYL infusion INTRAVENOUS Stopped (12/21/19 0202)  . heparin 550 Units/hr (12/23/19 1100)  . levETIRAcetam 2,000 mg (12/23/19 1101)  . midazolam 5 mg/hr (12/23/19 1100)  .  milrinone 0.25 mcg/kg/min (12/23/19 1100)  . norepinephrine (LEVOPHED) Adult infusion 60 mcg/min (12/23/19 1100)  . piperacillin-tazobactam (ZOSYN)  IV Stopped (12/23/19 1010)  . propofol (DIPRIVAN) infusion 55 mcg/kg/min (12/23/19 1100)  .  sodium bicarbonate (isotonic) infusion in sterile water 50 mL/hr at 12/23/19 1100  . vasopressin (PITRESSIN) infusion - *FOR SHOCK* 0.04 Units/min (12/23/19 1100)    PRN Medications: sodium chloride, acetaminophen, fentaNYL, ipratropium-albuterol, midazolam, ondansetron (ZOFRAN) IV, ondansetron (ZOFRAN) IV, sodium chloride flush, sodium chloride flush   Assessment/Plan:   1. VT/VF cardiac arrest 2. 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 Acute hypoxic respiratory failure  5. Severe anoxic brain injury  Has had large inferior MI with severe RV involvement. Hemodynamics have been stabilized with high-dose pressors and IABP but unfortunately has severe anoxic brain injury with ongoing seizure activity.    Family has decided to proceed with transition to comfort care later today. CDS seeing.   Will turn IABP off when support being withdrawn. (can pull device post-mortem). D/w cath lab team   HF team will s/o. Please call if we can help.   CRITICAL CARE Performed by: Glori Bickers  Total critical care time: 35 minutes  Critical care time was exclusive of separately billable procedures and treating other patients.  Critical care was necessary to treat or prevent imminent or life-threatening deterioration.  Critical care  was time spent personally by me (independent of midlevel providers or residents) on the following activities: development of treatment plan with patient and/or surrogate as well as nursing, discussions with consultants, evaluation of patient's response to treatment, examination of patient, obtaining history from patient or surrogate, ordering and performing treatments and interventions, ordering and review of laboratory studies, ordering and review of radiographic studies, pulse oximetry and re-evaluation of patient's condition.    Glori Bickers, MD  11:37 AM  Advanced Heart Failure Team Pager [phone] (M-F; 7a - 4p)  Please contact [facility] for night-coverage after hours (4p -7a ) and weekends on [email or web address]

## 2019-12-23 NOTE — Procedures (Signed)
Central Venous Catheter Insertion Procedure Note  Franklin Flores  [id number]  [birth year]  Date:12/23/19  Time:5:22 PM    Provider Performing: Guss Bunde    Procedure: Insertion of Arterial Line (39265) without US guidance  Indication(s) Blood pressure monitoring and/or need for frequent ABGs  Consent Unable to obtain consent due to emergent nature of procedure.  Anesthesia None   Time Out Verified patient identification, verified procedure, site/side was marked, verified correct patient position, special equipment/implants available, medications/allergies/relevant history reviewed, required imaging and test results available.   Sterile Technique Maximal sterile technique including full sterile barrier drape, hand hygiene, sterile gown, sterile gloves, mask, hair covering, sterile ultrasound probe cover (if used).   Procedure Description Area of catheter insertion was cleaned with chlorhexidine and draped in sterile fashion. Without real-time ultrasound guidance an arterial catheter was placed into the right radial artery.  Appropriate arterial tracings confirmed on monitor.     Complications/Tolerance None; patient tolerated the procedure well.   EBL minimal   Specimen(s) None

## 2019-12-24 ENCOUNTER — Inpatient Hospital Stay (HOSPITAL_COMMUNITY): Payer: Medicare Other

## 2019-12-24 ENCOUNTER — Encounter (HOSPITAL_COMMUNITY): Admission: EM | Disposition: E | Payer: Self-pay | Source: Home / Self Care | Attending: Internal Medicine

## 2019-12-24 ENCOUNTER — Encounter (HOSPITAL_COMMUNITY): Payer: Self-pay | Admitting: Certified Registered Nurse Anesthetist

## 2019-12-24 HISTORY — PX: ORGAN PROCUREMENT: SHX5270

## 2019-12-24 LAB — POCT I-STAT 7, (LYTES, BLD GAS, ICA,H+H)
Acid-Base Excess: 1 mmol/L (ref 0.0–2.0)
Acid-base deficit: 4 mmol/L — ABNORMAL HIGH (ref 0.0–2.0)
Bicarbonate: 20.1 mmol/L (ref 20.0–28.0)
Bicarbonate: 25.3 mmol/L (ref 20.0–28.0)
Calcium, Ion: 0.84 mmol/L — CL (ref 1.15–1.40)
Calcium, Ion: 0.96 mmol/L — ABNORMAL LOW (ref 1.15–1.40)
HCT: 19 % — ABNORMAL LOW (ref 39.0–52.0)
HCT: 19 % — ABNORMAL LOW (ref 39.0–52.0)
Hemoglobin: 6.5 g/dL — CL (ref 13.0–17.0)
Hemoglobin: 6.5 g/dL — CL (ref 13.0–17.0)
O2 Saturation: 98 %
O2 Saturation: 97 %
Patient temperature: 37
Patient temperature: 37.1
Potassium: 3.1 mmol/L — ABNORMAL LOW (ref 3.5–5.1)
Potassium: 3.5 mmol/L (ref 3.5–5.1)
Sodium: 145 mmol/L (ref 135–145)
Sodium: 141 mmol/L (ref 135–145)
TCO2: 21 mmol/L — ABNORMAL LOW (ref 22–32)
TCO2: 26 mmol/L (ref 22–32)
pCO2 arterial: 32 mmHg (ref 32.0–48.0)
pCO2 arterial: 38.1 mmHg (ref 32.0–48.0)
pH, Arterial: 7.406 (ref 7.350–7.450)
pH, Arterial: 7.431 (ref 7.350–7.450)
pO2, Arterial: 109 mmHg — ABNORMAL HIGH (ref 83.0–108.0)
pO2, Arterial: 89 mmHg (ref 83.0–108.0)

## 2019-12-24 LAB — TYPE AND SCREEN
ABO/RH(D): O NEG
Antibody Screen: NEGATIVE
Unit division: 0
Unit division: 0
Unit division: 0
Weak D: NEGATIVE

## 2019-12-24 LAB — BPAM RBC
Blood Product Expiration Date: 202107102359
Blood Product Expiration Date: 202107152359
Blood Product Expiration Date: 202107232359
ISSUE DATE / TIME: 202106230920
ISSUE DATE / TIME: 202106231827
ISSUE DATE / TIME: 202106242112
Unit Type and Rh: 9500
Unit Type and Rh: 9500
Unit Type and Rh: 9500

## 2019-12-24 LAB — URINALYSIS, ROUTINE W REFLEX MICROSCOPIC
Bilirubin Urine: NEGATIVE
Glucose, UA: NEGATIVE mg/dL
Ketones, ur: NEGATIVE mg/dL
Leukocytes,Ua: NEGATIVE
Nitrite: NEGATIVE
Protein, ur: NEGATIVE mg/dL
Specific Gravity, Urine: 1.028 (ref 1.005–1.030)
pH: 5 (ref 5.0–8.0)

## 2019-12-24 LAB — PROTIME-INR
INR: 1.5 — ABNORMAL HIGH (ref 0.8–1.2)
Prothrombin Time: 17.3 seconds — ABNORMAL HIGH (ref 11.4–15.2)

## 2019-12-24 LAB — CULTURE, RESPIRATORY W GRAM STAIN

## 2019-12-24 LAB — COMPREHENSIVE METABOLIC PANEL
ALT: 86 U/L — ABNORMAL HIGH (ref 0–44)
AST: 147 U/L — ABNORMAL HIGH (ref 15–41)
Albumin: 1.4 g/dL — ABNORMAL LOW (ref 3.5–5.0)
Alkaline Phosphatase: 58 U/L (ref 38–126)
Anion gap: 9 (ref 5–15)
BUN: 32 mg/dL — ABNORMAL HIGH (ref 8–23)
CO2: 22 mmol/L (ref 22–32)
Calcium: 5.7 mg/dL — CL (ref 8.9–10.3)
Chloride: 109 mmol/L (ref 98–111)
Creatinine, Ser: 1.27 mg/dL — ABNORMAL HIGH (ref 0.61–1.24)
GFR calc Af Amer: >60 mL/min (ref >60)
GFR calc non Af Amer: 58 mL/min — ABNORMAL LOW (ref >60)
Glucose, Bld: 102 mg/dL — ABNORMAL HIGH (ref 70–99)
Potassium: 3.5 mmol/L (ref 3.5–5.1)
Sodium: 140 mmol/L (ref 135–145)
Total Bilirubin: 1.7 mg/dL — ABNORMAL HIGH (ref 0.3–1.2)
Total Protein: 3.1 g/dL — ABNORMAL LOW (ref 6.5–8.1)

## 2019-12-24 LAB — TRIGLYCERIDES: Triglycerides: 159 mg/dL — ABNORMAL HIGH (ref <150)

## 2019-12-24 LAB — HEPARIN LEVEL (UNFRACTIONATED): Heparin Unfractionated: 0.1 IU/mL — ABNORMAL LOW (ref 0.30–0.70)

## 2019-12-24 LAB — CBC
HCT: 20.7 % — ABNORMAL LOW (ref 39.0–52.0)
Hemoglobin: 7.1 g/dL — ABNORMAL LOW (ref 13.0–17.0)
MCH: 32.1 pg (ref 26.0–34.0)
MCHC: 34.3 g/dL (ref 30.0–36.0)
MCV: 93.7 fL (ref 80.0–100.0)
Platelets: 46 10*3/uL — ABNORMAL LOW (ref 150–400)
RBC: 2.21 MIL/uL — ABNORMAL LOW (ref 4.22–5.81)
RDW: 17.6 % — ABNORMAL HIGH (ref 11.5–15.5)
WBC: 15.1 10*3/uL — ABNORMAL HIGH (ref 4.0–10.5)
nRBC: 1.2 % — ABNORMAL HIGH (ref 0.0–0.2)

## 2019-12-24 LAB — GLUCOSE, CAPILLARY
Glucose-Capillary: 101 mg/dL — ABNORMAL HIGH (ref 70–99)
Glucose-Capillary: 118 mg/dL — ABNORMAL HIGH (ref 70–99)
Glucose-Capillary: 68 mg/dL — ABNORMAL LOW (ref 70–99)
Glucose-Capillary: 85 mg/dL (ref 70–99)

## 2019-12-24 SURGERY — SURGICAL PROCUREMENT, ORGAN
Anesthesia: General | Site: Abdomen

## 2019-12-24 MED ORDER — GLYCOPYRROLATE 0.2 MG/ML IJ SOLN
0.2000 mg | INTRAMUSCULAR | Status: DC | PRN
  Administered 2019-12-24: 0.2 mg via INTRAVENOUS
  Filled 2019-12-24: qty 1

## 2019-12-24 MED ORDER — ACETAMINOPHEN 325 MG PO TABS: 650.0000 mg | ORAL_TABLET | Freq: Four times a day (QID) | ORAL | Status: DC | PRN

## 2019-12-24 MED ORDER — DEXTROSE 50 % IV SOLN
25.0000 mL | Freq: Once | INTRAVENOUS | Status: AC
  Administered 2019-12-24: 25 mL via INTRAVENOUS
  Filled 2019-12-24: qty 50

## 2019-12-24 MED ORDER — GLYCOPYRROLATE 1 MG PO TABS
1.0000 mg | ORAL_TABLET | ORAL | Status: DC | PRN
  Filled 2019-12-24: qty 1

## 2019-12-24 MED ORDER — ACETAMINOPHEN 650 MG RE SUPP: 650.0000 mg | Freq: Four times a day (QID) | RECTAL | Status: DC | PRN

## 2019-12-24 MED ORDER — DIPHENHYDRAMINE HCL 50 MG/ML IJ SOLN: 25.0000 mg | INTRAMUSCULAR | Status: DC | PRN

## 2019-12-24 MED ORDER — GLYCOPYRROLATE 0.2 MG/ML IJ SOLN: 0.2000 mg | INTRAMUSCULAR | Status: DC | PRN

## 2019-12-24 MED ORDER — LORAZEPAM 2 MG/ML IJ SOLN: 2.0000 mg | INTRAMUSCULAR | Status: DC | PRN

## 2019-12-24 MED ORDER — 0.9 % SODIUM CHLORIDE (POUR BTL) OPTIME
TOPICAL | Status: DC | PRN
  Administered 2019-12-24: 4000 mL

## 2019-12-24 MED ORDER — HEPARIN SODIUM (PORCINE) 1000 UNIT/ML IJ SOLN
6720.0000 [IU] | Freq: Once | INTRAMUSCULAR | Status: AC
  Administered 2019-12-24: 6720 [IU] via INTRAVENOUS
  Filled 2019-12-24: qty 6.7
  Filled 2019-12-24: qty 6.72

## 2019-12-24 MED ORDER — POLYVINYL ALCOHOL 1.4 % OP SOLN
1.0000 [drp] | Freq: Four times a day (QID) | OPHTHALMIC | Status: DC | PRN
  Filled 2019-12-24: qty 15

## 2019-12-24 MED ORDER — DEXTROSE 5 % IV SOLN: INTRAVENOUS | Status: DC

## 2019-12-24 SURGICAL SUPPLY — 85 items
BLADE CLIPPER SURG (BLADE) IMPLANT
BLADE SAW STERNAL (BLADE) ×3 IMPLANT
BLADE SURG 10 STRL SS (BLADE) IMPLANT
CLIP VESOCCLUDE MED 24/CT (CLIP) IMPLANT
CLIP VESOCCLUDE SM WIDE 24/CT (CLIP) IMPLANT
CNTNR URN SCR LID CUP LEK RST (MISCELLANEOUS) ×1 IMPLANT
CONT SPEC 4OZ STRL OR WHT (MISCELLANEOUS) ×3
COVER BACK TABLE 60X90IN (DRAPES) IMPLANT
COVER MAYO STAND STRL (DRAPES) IMPLANT
COVER SURGICAL LIGHT HANDLE (MISCELLANEOUS) ×3 IMPLANT
COVER WAND RF STERILE (DRAPES) IMPLANT
DRAPE HALF SHEET 40X57 (DRAPES) IMPLANT
DRAPE SLUSH MACHINE 52X66 (DRAPES) ×6 IMPLANT
DRSG COVADERM 4X10 (GAUZE/BANDAGES/DRESSINGS) IMPLANT
DRSG COVADERM 4X8 (GAUZE/BANDAGES/DRESSINGS) ×3 IMPLANT
DRSG TELFA 3X8 NADH (GAUZE/BANDAGES/DRESSINGS) ×3 IMPLANT
DURAPREP 26ML APPLICATOR (WOUND CARE) IMPLANT
ELECT BLADE 6.5 EXT (BLADE) IMPLANT
ELECT REM PT RETURN 9FT ADLT (ELECTROSURGICAL) ×6
ELECTRODE REM PT RTRN 9FT ADLT (ELECTROSURGICAL) ×2 IMPLANT
GAUZE 4X4 16PLY RFD (DISPOSABLE) IMPLANT
GLOVE BIO SURGEON STRL SZ7 (GLOVE) IMPLANT
GLOVE BIO SURGEON STRL SZ7.5 (GLOVE) IMPLANT
GLOVE BIO SURGEON STRL SZ8 (GLOVE) IMPLANT
GLOVE BIO SURGEON STRL SZ8.5 (GLOVE) IMPLANT
GLOVE BIOGEL PI IND STRL 7.0 (GLOVE) IMPLANT
GLOVE BIOGEL PI IND STRL 7.5 (GLOVE) IMPLANT
GLOVE BIOGEL PI IND STRL 8 (GLOVE) IMPLANT
GLOVE BIOGEL PI IND STRL 8.5 (GLOVE) IMPLANT
GLOVE BIOGEL PI INDICATOR 7.0 (GLOVE)
GLOVE BIOGEL PI INDICATOR 7.5 (GLOVE)
GLOVE BIOGEL PI INDICATOR 8 (GLOVE)
GLOVE BIOGEL PI INDICATOR 8.5 (GLOVE)
GLOVE SURG SS PI 7.0 STRL IVOR (GLOVE) IMPLANT
GLOVE SURG SS PI 7.5 STRL IVOR (GLOVE) IMPLANT
GLOVE SURG SS PI 8.0 STRL IVOR (GLOVE) IMPLANT
GOWN STRL REUS W/ TWL LRG LVL3 (GOWN DISPOSABLE) ×4 IMPLANT
GOWN STRL REUS W/ TWL XL LVL3 (GOWN DISPOSABLE) ×2 IMPLANT
GOWN STRL REUS W/TWL LRG LVL3 (GOWN DISPOSABLE) ×12
GOWN STRL REUS W/TWL XL LVL3 (GOWN DISPOSABLE) ×6
HANDLE SUCTION POOLE (INSTRUMENTS) ×1 IMPLANT
KIT POST MORTEM ADULT 36X90 (BAG) ×3 IMPLANT
KIT TURNOVER KIT B (KITS) ×3 IMPLANT
LOOP VESSEL MAXI BLUE (MISCELLANEOUS) IMPLANT
LOOP VESSEL MINI RED (MISCELLANEOUS) IMPLANT
MANIFOLD NEPTUNE II (INSTRUMENTS) ×3 IMPLANT
NEEDLE BIOPSY 14X6 SOFT TISS (NEEDLE) IMPLANT
NS IRRIG 1000ML POUR BTL (IV SOLUTION) IMPLANT
PACK AORTA (CUSTOM PROCEDURE TRAY) ×3 IMPLANT
PAD ARMBOARD 7.5X6 YLW CONV (MISCELLANEOUS) ×6 IMPLANT
PENCIL BUTTON HOLSTER BLD 10FT (ELECTRODE) ×3 IMPLANT
SOL PREP POV-IOD 4OZ 10% (MISCELLANEOUS) ×6 IMPLANT
SPONGE INTESTINAL PEANUT (DISPOSABLE) IMPLANT
SPONGE LAP 18X18 RF (DISPOSABLE) IMPLANT
STAPLER VISISTAT 35W (STAPLE) ×3 IMPLANT
SUCTION POOLE HANDLE (INSTRUMENTS) ×3
SUT BONE WAX W31G (SUTURE) IMPLANT
SUT ETHIBOND 5 LR DA (SUTURE) IMPLANT
SUT ETHILON 1 LR 30 (SUTURE) IMPLANT
SUT ETHILON 2 LR (SUTURE) IMPLANT
SUT PROLENE 3 0 RB 1 (SUTURE) IMPLANT
SUT PROLENE 3 0 SH 1 (SUTURE) IMPLANT
SUT PROLENE 4 0 RB 1 (SUTURE)
SUT PROLENE 4-0 RB1 .5 CRCL 36 (SUTURE) IMPLANT
SUT PROLENE 5 0 C 1 24 (SUTURE) IMPLANT
SUT PROLENE 6 0 BV (SUTURE) IMPLANT
SUT SILK 0 TIES 10X30 (SUTURE) IMPLANT
SUT SILK 1 SH (SUTURE) IMPLANT
SUT SILK 1 TIES 10X30 (SUTURE) IMPLANT
SUT SILK 2 0 (SUTURE)
SUT SILK 2 0 SH (SUTURE) IMPLANT
SUT SILK 2 0 SH CR/8 (SUTURE) ×3 IMPLANT
SUT SILK 2 0 TIES 10X30 (SUTURE) IMPLANT
SUT SILK 2-0 18XBRD TIE 12 (SUTURE) IMPLANT
SUT SILK 3 0 SH CR/8 (SUTURE) IMPLANT
SUT SILK 3 0 TIES 10X30 (SUTURE) IMPLANT
SWAB COLLECTION DEVICE MRSA (MISCELLANEOUS) IMPLANT
SWAB CULTURE ESWAB REG 1ML (MISCELLANEOUS) IMPLANT
SYR 50ML LL SCALE MARK (SYRINGE) IMPLANT
SYRINGE TOOMEY DISP (SYRINGE) IMPLANT
TAPE UMBILICAL 1/8 X36 TWILL (MISCELLANEOUS) IMPLANT
TUBE CONNECTING 12'X1/4 (SUCTIONS) ×1
TUBE CONNECTING 12X1/4 (SUCTIONS) ×2 IMPLANT
WATER STERILE IRR 1000ML POUR (IV SOLUTION) IMPLANT
YANKAUER SUCT BULB TIP NO VENT (SUCTIONS) ×3 IMPLANT

## 2019-12-27 LAB — CULTURE, BLOOD (ROUTINE X 2)
Culture: NO GROWTH
Culture: NO GROWTH
Special Requests: ADEQUATE
Special Requests: ADEQUATE

## 2019-12-27 LAB — SURGICAL PATHOLOGY

## 2019-12-28 ENCOUNTER — Encounter (HOSPITAL_COMMUNITY): Payer: Self-pay

## 2019-12-30 NOTE — OR Nursing (Signed)
Patients time of death is 1126. Crossclamp placed at 1133

## 2019-12-30 NOTE — Procedures (Signed)
Extubation Procedure Note  Patient Details:   Name: Franklin Flores DOB: January 31, 1951 MRN: [id number]   Airway Documentation:    Vent end date: 2020-01-04 Vent end time: 1100   Evaluation  Pt extubated in OR with plans to be an Organ Donor.    Carolan Shiver Jan 04, 2020, 11:10 AM

## 2019-12-30 NOTE — Death Summary Note (Signed)
DEATH SUMMARY   Patient Details  Name: Franklin Flores MRN: [id number] DOB: 28-May-1951  Admission/Discharge Information   Admit Date:  08-Jan-2020  Date of Death: Date of Death: 01/12/20  Time of Death: Time of Death: Nov 19, 1124  Length of Stay: 4  Referring Physician: Salley Scarlet, MD   Reason(s) for Hospitalization  Out of hospital Ventricular Fibrillation Arrest secondary to STEMI of RCA  Anoxic status epilepticus  Cardiogenic shock  Diagnoses  Preliminary cause of death:  Secondary Diagnoses (including complications and co-morbidities):  Principal Problem:   STEMI (ST elevation myocardial infarction) [facility]) Active Problems:   Cardiac arrest [facility])   Brief Hospital Course (including significant findings, care, treatment, and services provided and events leading to death)  Patient is a 69 year old male with probable history of COPD seizure disorder for witnessed arrest at home.  No CPR was initiated until EMS arrived about 7 minutes after his initial arrest.  He was initially in torsades shocked received magnesium and epinephrine and round of being shocked twice more.  In the ER he was started on amiodarone found to have ST elevation in inferior leads with urgent cardiac catheterization performed. My evaluation at bedside the patient does have some spontaneous movement.  He is ventilated.  He is on Levophed 30 mics along with amiodarone, he is mechanically ventilated.  Balloon pump is in place.  Patient went into refractory status epilepticus requiring burst suppression.  He also had profound cardiogenic shock despite inotropes and balloon pump.  He was supported with targeted temperature management for a few days before it was determined that his condition was not improving.  Family then elected to allow patient to pass in peace.   Pertinent Labs and Studies  Significant Diagnostic Studies CARDIAC CATHETERIZATION  Addendum Date: 01-08-20    Mid RCA lesion is 50% stenosed.   Prox RCA lesion is 100% stenosed.  A drug-eluting stent was successfully placed using a STENT RESOLUTE ONYX 3.0X22.  Post intervention, there is a 0% residual stenosis.  The left ventricular ejection fraction is 35-45% by visual estimate.  There is moderate left ventricular systolic dysfunction.  LV end diastolic pressure is low.  There is no aortic valve stenosis.  Ao sat 100%, PA sat 51%, PA 32/12; mean PA pressure 21 mm Hg, mean PCWP 7 mm Hg; RA pressure 11; CO 2.67 L/min; CI 1.52  Successful IABP placement via right femoral artery.  Cardiogenic shock likely from RV failure in the setting of inferior MI.  Will aggressively hydrate given low LVEDP and RA pressure > PCWP/LVEDP.  Continue IV Amio.  IV Cangrelor bridge until OG tube placed and Brilinta can be given.  Continue Cangrelor 4 hours after the Brilinta load since he will be cooled. Prognosis will depend on neuro recovery.  I updated his daughter who was in the waiting room.   Result Date: 08-Jan-2020  Mid RCA lesion is 50% stenosed.  Prox RCA lesion is 100% stenosed.  A drug-eluting stent was successfully placed using a STENT RESOLUTE ONYX 3.0X22.  Post intervention, there is a 0% residual stenosis.  The left ventricular ejection fraction is 35-45% by visual estimate.  There is moderate left ventricular systolic dysfunction.  LV end diastolic pressure is low.  There is no aortic valve stenosis.  Ao sat 100%, PA sat 51%, PA 32/12; mean PA pressure 21 mm Hg, mean PCWP 7 mm Hg; RA pressure 11; CO 2.67 L/min; CI 1.52  Successful IABP placement via right femoral artery.  Cardiogenic shock likely from  RV failure in the setting of inferior MI.  Will aggressively hydrate given low LVEDP and RA pressure > PCWP/LVEDP.  Continue IV Amio.  IV Cangrelor bridge until OG tube placed and Brilinta can be given.  Continue Cangrelor 4 hours after the Brilinta load since he will be cooled. Prognosis will depend on neuro recovery.    DG CHEST PORT 1  VIEW  Result Date: 12/21/2019 CLINICAL DATA:  Management of intra-aortic balloon pump. EXAM: PORTABLE CHEST 1 VIEW COMPARISON:  Radiograph yesterday. FINDINGS: Endotracheal tube tip is at the level of the clavicular heads. Enteric tube in place, tip below the diaphragm, however the side-port in the region of the gastroesophageal junction. There is Swan-Ganz catheter from an inferior approach with tip in the region of the right pulmonary outflow tract. Intra-aortic balloon pump with radiopaque marker projecting over the aortic arch. Heart is normal in size. Diffuse interstitial thickening likely pulmonary edema superimposed on emphysema. There is no pneumothorax or confluent airspace disease. Trace fluid in the right minor fissure. IMPRESSION: 1. Endotracheal tube tip at the level of the clavicular heads. Enteric tube in place, tip below the diaphragm, however side-port in the region of the gastroesophageal junction. Recommend advancement of at least 3 cm for optimal placement. 2. Swan-Ganz catheter from an inferior approach with tip in the right pulmonary outflow tract. Intra-aortic balloon pump with radiopaque marker projecting over the aortic arch. 3. Interstitial thickening likely pulmonary edema superimposed on emphysema. Electronically Signed   By: Narda Rutherford M.D.   On: 12/21/2019 00:29   DG Chest Portable 1 View  Result Date: 12/19/2019 CLINICAL DATA:  Status post intubation. EXAM: PORTABLE CHEST 1 VIEW COMPARISON:  May 30, 2014. FINDINGS: The heart size and mediastinal contours are within normal limits. Endotracheal tube is seen in grossly good position. No pneumothorax or pleural effusion is noted. Hyperexpansion of the lungs is noted. Mild interstitial densities are noted throughout both lungs which may represent scarring, but acute superimposed edema or inflammation cannot be excluded. The visualized skeletal structures are unremarkable. IMPRESSION: Endotracheal tube in grossly good  position. Hyperexpansion of the lungs. Mild interstitial densities are noted throughout both lungs which may represent scarring, but acute superimposed edema or inflammation cannot be excluded. Electronically Signed   By: Lupita Raider M.D.   On: 12/26/2019 20:58   DG Abd Portable 1V  Result Date: 12/21/2019 CLINICAL DATA:  Orogastric tube placement EXAM: PORTABLE ABDOMEN - 1 VIEW COMPARISON:  None. FINDINGS: Side port of the orogastric tube is in the distal esophagus, just above the gastroesophageal junction. Recommend advancing by 7 cm. IMPRESSION: Side port of the orogastric tube in the distal esophagus. Recommend advancing by 7 cm to ensure intragastric positioning. Electronically Signed   By: Deatra Robinson M.D.   On: 12/21/2019 00:17   EEG adult  Result Date: 12/21/2019 Charlsie Quest, MD     12/22/2019  9:12 AM .Patient Name: Franklin Flores MRN: [id number] Epilepsy Attending: Charlsie Quest Referring Physician/Provider: Dr. Levon Hedger Date: 12/21/2019 Duration: 25.21 minutes Patient history: 69 year old male with history of seizures status post cardiac arrest.  EEG evaluate for seizures Level of alertness: comatose AEDs during EEG study: Phenytoin, Keppra, propofol, Versed Technical aspects: This EEG study was done with scalp electrodes positioned according to the 10-20 International system of electrode placement. Electrical activity was acquired at a sampling rate of 500Hz  and reviewed with a high frequency filter of 70Hz  and a low frequency filter of 1Hz . EEG data were recorded continuously  and digitally stored. Description: EEG showed burst suppression pattern with generalized EEG suppression lasting 6 to 10 seconds with generalized bursts of highly epileptiform discharges lasting 2 to 5 seconds.  EEG was not reactive to tactile stimulation.  Hyperventilation and photic stimulation were not performed.   ABNORMALITY -Burst suppression with highly epileptiform discharges IMPRESSION: This study  showed evidence of generalized epileptogenicity and profound diffuse encephalopathy nonspecific to etiology but could be related to diffuse anoxic/hypoxic brain injury. Priyanka Annabelle Harman   Overnight EEG with video  Result Date: 12/22/2019 Charlsie Quest, MD     12/22/2019  2:01 PM Patient Name: Franklin Flores MRN: [id number] Epilepsy Attending: Charlsie Quest Referring Physician/Provider: Joneen Roach, NP Duration: 12/22/2019 5956 to 12/23/2019 3875  Patient history: 69 year old male with history of seizures status post cardiac arrest.  EEG evaluate for seizures  Level of alertness: comatose  AEDs during EEG study: Phenytoin, Keppra, propofol, Versed  Technical aspects: This EEG study was done with scalp electrodes positioned according to the 10-20 International system of electrode placement. Electrical activity was acquired at a sampling rate of 500Hz  and reviewed with a high frequency filter of 70Hz  and a low frequency filter of 1Hz . EEG data were recorded continuously and digitally stored. Description: EEG initially showed convulsive status epilepticus with frequent seizures during which patient was noted to have brief eye opening and at times subtle axial jerking. Concomitant eeg showed generalized spikes and wave at 8Hz  which evolve into 5-6hz  theta followed by 1.5-2Hz  delta spike and wave.  As sedation was titrated up, EEG showed burst suppression pattern with highly epileptiform bursts. ABNORMALITY -Convulsive status epilepticus, generalized -Burst suppression with highly epileptiform discharges  IMPRESSION: This study  initially showed evidence of convulsive status epilepticus which improved after sedation was titrated up but continued to show evidence of generalized epileptogenicity and profound diffuse encephalopathy nonspecific to etiology but could be related to diffuse anoxic/hypoxic brain injury.   ECHOCARDIOGRAM COMPLETE  Result Date: 12/21/2019    ECHOCARDIOGRAM REPORT   Patient Name:    Franklin Flores Date of Exam: 12/21/2019 Medical Rec #:     Height:       70.0 in Accession #:    12/23/2019   Weight:       117.3 lb Date of Birth:  1950-11-12    BSA:          1.664 m Patient Age:    69 years     BP:           90/64 mmHg Patient Gender: M            HR:           112 bpm. Exam Location:  Inpatient Procedure: 2D Echo, Cardiac Doppler, Color Doppler and Intracardiac            Opacification Agent STAT ECHO Indications:    Cardiac arrest I46.9  History:        Patient has no prior history of Echocardiogram examinations.                 COPD, Arrythmias:Cardiac Arrest and STEMI; Risk Factors:Current                 Smoker.  Sonographer:    12/23/2019 RDCS Referring Phys: [id number]  Sonographer Comments: Echo performed with patient supine and on artificial respirator. IMPRESSIONS  1. The inferior wall is not adequately visualized to comment on wall motion abnormality.     .Left  ventricular ejection fraction, by estimation, is 60 to 65%. The left ventricle has normal function. The left ventricle has no regional wall motion abnormalities. Left ventricular diastolic function could not be evaluated. Abnormal (paradoxical)  septal motion, consistent with RV pacemaker.  2. Right ventricular systolic function is severely reduced. The right ventricular size is severely enlarged. There is normal pulmonary artery systolic pressure. The estimated right ventricular systolic pressure is 19.6 mmHg.  3. The mitral valve is normal in structure. No evidence of mitral valve regurgitation. No evidence of mitral stenosis.  4. The aortic valve has an indeterminant number of cusps. Aortic valve regurgitation is not visualized. Mild aortic valve sclerosis is present, with no evidence of aortic valve stenosis.  5. The inferior vena cava is normal in size with greater than 50% respiratory variability, suggesting right atrial pressure of 3 mmHg. FINDINGS  Left Ventricle: The inferior wall is not adequately  visualized to comment on wall motion abnormality. Left ventricular ejection fraction, by estimation, is 60 to 65%. The left ventricle has normal function. The left ventricle has no regional wall motion abnormalities. Definity contrast agent was given IV to delineate the left ventricular endocardial borders. The left ventricular internal cavity size was normal in size. There is no left ventricular hypertrophy. Abnormal (paradoxical) septal motion, consistent with RV pacemaker. Left ventricular diastolic function could not be evaluated. Right Ventricle: The right ventricular size is severely enlarged. No increase in right ventricular wall thickness. Right ventricular systolic function is severely reduced. There is normal pulmonary artery systolic pressure. The tricuspid regurgitant velocity is 2.04 m/s, and with an assumed right atrial pressure of 3 mmHg, the estimated right ventricular systolic pressure is 19.6 mmHg. Left Atrium: Left atrial size was normal in size. Right Atrium: Right atrial size was not well visualized. Pericardium: There is no evidence of pericardial effusion. Mitral Valve: The mitral valve is normal in structure. Normal mobility of the mitral valve leaflets. No evidence of mitral valve regurgitation. No evidence of mitral valve stenosis. Tricuspid Valve: The tricuspid valve is normal in structure. Tricuspid valve regurgitation is mild . No evidence of tricuspid stenosis. Aortic Valve: The aortic valve has an indeterminant number of cusps. Aortic valve regurgitation is not visualized. Mild aortic valve sclerosis is present, with no evidence of aortic valve stenosis. Pulmonic Valve: The pulmonic valve was normal in structure. Pulmonic valve regurgitation is not visualized. No evidence of pulmonic stenosis. Aorta: The aortic root is normal in size and structure. Venous: The inferior vena cava is normal in size with greater than 50% respiratory variability, suggesting right atrial pressure of 3 mmHg.  IAS/Shunts: No atrial level shunt detected by color flow Doppler. Additional Comments: A pacer wire is visualized.  LEFT VENTRICLE PLAX 2D LVIDd:         3.60 cm LVIDs:         2.80 cm LV PW:         0.70 cm LV IVS:        0.70 cm LVOT diam:     1.70 cm LV SV:         23 LV SV Index:   14 LVOT Area:     2.27 cm  LV Volumes (MOD) LV vol d, MOD A4C: 43.5 ml LV vol s, MOD A4C: 25.7 ml LV SV MOD A4C:     43.5 ml RIGHT VENTRICLE RV S prime:     7.53 cm/s TAPSE (M-mode): 1.6 cm LEFT ATRIUM  Index      RIGHT ATRIUM          Index LA diam:      2.20 cm 1.32 cm/m RA Area:     6.93 cm LA Vol (A4C): 15.0 ml 9.02 ml/m RA Volume:   11.20 ml 6.73 ml/m  AORTIC VALVE LVOT Vmax:   73.90 cm/s LVOT Vmean:  46.800 cm/s LVOT VTI:    0.103 m  AORTA Ao Asc diam: 3.20 cm MITRAL VALVE               TRICUSPID VALVE MV Area (PHT): 3.99 cm    TR Peak grad:   16.6 mmHg MV Decel Time: 190 msec    TR Vmax:        204.00 cm/s MV E velocity: 43.70 cm/s MV A velocity: 42.20 cm/s  SHUNTS MV E/A ratio:  1.04        Systemic VTI:  0.10 m                            Systemic Diam: 1.70 cm Armanda Magic MD Electronically signed by Armanda Magic MD Signature Date/Time: 12/21/2019/10:07:29 AM    Final    DG OR LOCAL ABDOMEN  Result Date: 12/29/2019 CLINICAL DATA:  Organ procurement. EXAM: OR LOCAL ABDOMEN COMPARISON:  12/28/2019 FINDINGS: Postsurgical changes from bilateral kidney harvesting noted. No metallic radiopaque foreign bodies noted within the abdomen or pelvis. Atherosclerotic calcifications are identified within the branch vessels of the abdominal aorta. IMPRESSION: No evidence for metallic foreign bodies. Electronically Signed   By: Signa Kell M.D.   On: 12/23/2019 13:55    Microbiology Recent Results (from the past 240 hour(s))  SARS Coronavirus 2 by RT PCR (hospital order, performed in [facility] hospital lab) Nasopharyngeal Nasopharyngeal Swab     Status: None   Collection Time: 12/23/2019  8:46 PM   Specimen:  Nasopharyngeal Swab  Result Value Ref Range Status   SARS Coronavirus 2 NEGATIVE NEGATIVE Final    Comment: (NOTE) SARS-CoV-2 target nucleic acids are NOT DETECTED.  The SARS-CoV-2 RNA is generally detectable in upper and lower respiratory specimens during the acute phase of infection. The lowest concentration of SARS-CoV-2 viral copies this assay can detect is 250 copies / mL. A negative result does not preclude SARS-CoV-2 infection and should not be used as the sole basis for treatment or other patient management decisions.  A negative result may occur with improper specimen collection / handling, submission of specimen other than nasopharyngeal swab, presence of viral mutation(s) within the areas targeted by this assay, and inadequate number of viral copies (<250 copies / mL). A negative result must be combined with clinical observations, patient history, and epidemiological information.  Fact Sheet for Patients:   BoilerBrush.com.cy  Fact Sheet for Healthcare Providers: https://pope.com/  This test is not yet approved or  cleared by the [place] FDA and has been authorized for detection and/or diagnosis of SARS-CoV-2 by FDA under an Emergency Use Authorization (EUA).  This EUA will remain in effect (meaning this test can be used) for the duration of the COVID-19 declaration under Section 564(b)(1) of the Act, 21 U.S.C. section 360bbb-3(b)(1), unless the authorization is terminated or revoked sooner.  Performed at [facility], [address], [place], [place] [address]   MRSA PCR Screening     Status: None   Collection Time: 12/21/19  2:38 PM   Specimen: Nasal Mucosa; Nasopharyngeal  Result Value  Ref Range Status   MRSA by PCR NEGATIVE NEGATIVE Final    Comment:        The GeneXpert MRSA Assay (FDA approved for NASAL specimens only), is one component of a comprehensive MRSA colonization surveillance program. It  is not intended to diagnose MRSA infection nor to guide or monitor treatment for MRSA infections. Performed at [facility], [address] [place], [place] [address]   Culture, blood (routine x 2)     Status: None   Collection Time: 12/22/19  3:44 PM   Specimen: BLOOD LEFT HAND  Result Value Ref Range Status   Specimen Description BLOOD LEFT HAND  Final   [facility]   Final    BOTTLES DRAWN AEROBIC ONLY Blood Culture adequate volume   Culture   Final    NO GROWTH 5 DAYS Performed at [facility], [address] [place], [place] [address]    Report Status 12/27/2019 FINAL  Final  Culture, blood (routine x 2)     Status: None   Collection Time: 12/22/19  3:44 PM   Specimen: BLOOD LEFT HAND  Result Value Ref Range Status   Specimen Description BLOOD LEFT HAND  Final   Special Requests   Final    BOTTLES DRAWN AEROBIC ONLY Blood Culture adequate volume   Culture   Final    NO GROWTH 5 DAYS Performed at [facility], [address] [place], [place] [address]    Report Status 12/27/2019 FINAL  Final  Culture, respiratory (non-expectorated)     Status: None   Collection Time: 12/22/19  4:03 PM   Specimen: Tracheal Aspirate; Respiratory  Result Value Ref Range Status   Specimen Description TRACHEAL ASPIRATE  Final   Special Requests NONE  Final   Gram Stain   Final    RARE WBC PRESENT, PREDOMINANTLY PMN FEW GRAM NEGATIVE RODS RARE GRAM POSITIVE COCCI IN CLUSTERS Performed at [facility], [address] [place], [place] [address]    Culture MODERATE ESCHERICHIA COLI  Final   Report Status 10/01/2019 FINAL  Final   Organism ID, Bacteria ESCHERICHIA COLI  Final      Susceptibility   Escherichia coli - MIC*    AMPICILLIN <=2 SENSITIVE Sensitive     CEFAZOLIN <=4 SENSITIVE Sensitive     CEFEPIME <=0.12 SENSITIVE Sensitive     CEFTAZIDIME <=1 SENSITIVE Sensitive     CEFTRIAXONE <=0.25 SENSITIVE Sensitive     CIPROFLOXACIN <=0.25 SENSITIVE  Sensitive     GENTAMICIN <=1 SENSITIVE Sensitive     IMIPENEM <=0.25 SENSITIVE Sensitive     TRIMETH/SULFA <=20 SENSITIVE Sensitive     AMPICILLIN/SULBACTAM <=2 SENSITIVE Sensitive     PIP/TAZO <=4 SENSITIVE Sensitive     * MODERATE ESCHERICHIA COLI  Culture, Urine     Status: None   Collection Time: 12/22/19  4:27 PM   Specimen: Urine, Random  Result Value Ref Range Status   Specimen Description URINE, RANDOM  Final   Special Requests NONE  Final   Culture   Final    NO GROWTH Performed at [facility], [address] [place], [place] [address]    Report Status 12/23/2019 FINAL  Final  Resp Panel by RT PCR (RSV, Flu A&B, Covid) - Nasopharyngeal Swab     Status: None   Collection Time: 12/22/19  5:08 PM   Specimen: Nasopharyngeal Swab  Result Value Ref Range Status   SARS Coronavirus 2 by RT  PCR NEGATIVE NEGATIVE Final    Comment: (NOTE) SARS-CoV-2 target nucleic acids are NOT DETECTED.  The SARS-CoV-2 RNA is generally detectable in upper respiratoy specimens during the acute phase of infection. The lowest concentration of SARS-CoV-2 viral copies this assay can detect is 131 copies/mL. A negative result does not preclude SARS-Cov-2 infection and should not be used as the sole basis for treatment or other patient management decisions. A negative result may occur with  improper specimen collection/handling, submission of specimen other than nasopharyngeal swab, presence of viral mutation(s) within the areas targeted by this assay, and inadequate number of viral copies (<131 copies/mL). A negative result must be combined with clinical observations, patient history, and epidemiological information. The expected result is Negative.  Fact Sheet for Patients:  https://www.moore.com/  Fact Sheet for Healthcare Providers:  https://www.young.biz/  This test is no t yet approved or cleared by the [place] FDA and  has been  authorized for detection and/or diagnosis of SARS-CoV-2 by FDA under an Emergency Use Authorization (EUA). This EUA will remain  in effect (meaning this test can be used) for the duration of the COVID-19 declaration under Section 564(b)(1) of the Act, 21 U.S.C. section 360bbb-3(b)(1), unless the authorization is terminated or revoked sooner.     Influenza A by PCR NEGATIVE NEGATIVE Final   Influenza B by PCR NEGATIVE NEGATIVE Final    Comment: (NOTE) The Xpert Xpress SARS-CoV-2/FLU/RSV assay is intended as an aid in  the diagnosis of influenza from Nasopharyngeal swab specimens and  should not be used as a sole basis for treatment. Nasal washings and  aspirates are unacceptable for Xpert Xpress SARS-CoV-2/FLU/RSV  testing.  Fact Sheet for Patients: https://www.moore.com/  Fact Sheet for Healthcare Providers: https://www.young.biz/  This test is not yet approved or cleared by the [place] FDA and  has been authorized for detection and/or diagnosis of SARS-CoV-2 by  FDA under an Emergency Use Authorization (EUA). This EUA will remain  in effect (meaning this test can be used) for the duration of the  Covid-19 declaration under Section 564(b)(1) of the Act, 21  U.S.C. section 360bbb-3(b)(1), unless the authorization is  terminated or revoked.    Respiratory Syncytial Virus by PCR NEGATIVE NEGATIVE Final    Comment: (NOTE) Fact Sheet for Patients: https://www.moore.com/  Fact Sheet for Healthcare Providers: https://www.young.biz/  This test is not yet approved or cleared by the [place] FDA and  has been authorized for detection and/or diagnosis of SARS-CoV-2 by  FDA under an Emergency Use Authorization (EUA). This EUA will remain  in effect (meaning this test can be used) for the duration of the  COVID-19 declaration under Section 564(b)(1) of the Act, 21 U.S.C.  section 360bbb-3(b)(1),  unless the authorization is terminated or  revoked. Performed at [facility], [address] [place], [place] [address]     Lab Basic Metabolic Panel: Recent Labs  Lab 12/22/19 0451 12/22/19 0451 12/22/19 1547 12/22/19 1547 12/23/19 0321 12/23/19 1759 12/29/2019 0045 12/23/2019 0422 12/23/2019 0424  NA 143   < > 141   < > 141 140 145 140 141  K 3.6   < > 4.1   < > 4.0 3.8 3.1* 3.5 3.5  CL 110  --  110  --  109 109  --  109  --   CO2 24  --  23  --  23 25  --  22  --   GLUCOSE 121*  --  116*  --  105*  96  --  102*  --   BUN 25*  --  29*  --  29* 35*  --  32*  --   CREATININE 1.13  --  1.11  --  1.20 1.36*  --  1.27*  --   CALCIUM 6.7*  --  6.2*  --  5.9* 5.8*  --  5.7*  --   MG 1.7  --  2.4  --  2.1  --   --   --   --   PHOS 3.8  --  3.9  --  4.6  --   --   --   --    < > = values in this interval not displayed.   Liver Function Tests: Recent Labs  Lab 12/23/19 0321 12/23/19 1759 12/21/2019 0422  AST 197* 179* 147*  ALT 99* 95* 86*  ALKPHOS 53 49 58  BILITOT 1.1 1.3* 1.7*  PROT <3.0* <3.0* 3.1*  ALBUMIN 1.6* 1.4* 1.4*   No results for input(s): LIPASE, AMYLASE in the last 168 hours. No results for input(s): AMMONIA in the last 168 hours. CBC: Recent Labs  Lab 12/22/19 1547 12/22/19 1547 12/23/19 0015 12/23/19 0015 12/23/19 0321 12/23/19 1759 12/29/2019 0045 11/30/2019 0422 12/04/2019 0424  WBC 16.6*  --  13.5*  --  13.6* 14.6*  --  15.1*  --   NEUTROABS  --   --   --   --  11.2*  --   --   --   --   HGB 6.6*   < > 7.2*   < > 7.0* 5.7* 6.5* 7.1* 6.5*  HCT 19.1*   < > 20.8*   < > 19.8* 16.3* 19.0* 20.7* 19.0*  MCV 94.6  --  91.2  --  92.1 92.1  --  93.7  --   PLT 77*  --  60*  --  59* 52*  --  46*  --    < > = values in this interval not displayed.   Cardiac Enzymes: No results for input(s): CKTOTAL, CKMB, CKMBINDEX, TROPONINI in the last 168 hours. Sepsis Labs: Recent Labs  Lab 12/23/19 0015 12/23/19 0321 12/23/19 1759 12/27/2019 0422  WBC 13.5* 13.6*  14.6* 15.1*   Nikiah Goin C Janine Reller 12/28/2019, 1:46 PM

## 2019-12-30 NOTE — Progress Notes (Signed)
   NAME:  Franklin Flores, MRN:  [id number], DOB:  19-May-1951, LOS: 4 ADMISSION DATE:  12/22/2019, CONSULTATION DATE: 12/17/2019 REFERRING MD: Eldridge Dace, CHIEF COMPLAINT: Status post cardiac arrest  Brief History   69 year old male who arrested at home  History of present illness   Patient is a 69 year old male with probable history of COPD seizure disorder for witnessed arrest at home.  No CPR was initiated until EMS arrived about 7 minutes after his initial arrest.  He was initially in torsades shocked received magnesium and epinephrine and round of being shocked twice more.  In the ER he was started on amiodarone found to have ST elevation in inferior leads with urgent cardiac catheterization performed. My evaluation at bedside the patient does have some spontaneous movement.  He is ventilated.  He is on Levophed 30 mics along with amiodarone, he is mechanically ventilated.  Balloon pump is in place.  Past Medical History   . Cataract   . COPD (chronic obstructive pulmonary disease) (HCC)   . Seizures (HCC)   . Smoker      Significant Hospital Events   Resuscitated emergently intubated 12/17/2019 Cardiac catheterization 12/22/2019  Consults:  PCCM  Procedures:  As above  Significant Diagnostic Tests:  Initial electrolytes are essentially normal slight elevation of LFTs  Micro Data:  NA  Antimicrobials:  NA  Interim history/subjective:  Remains in profound shock with cortical irritability on EEG. Plans for OR later today to withdraw and organ procurement. Family at bedside.  Objective   Blood pressure 104/77, pulse 72, temperature 98.1 F (36.7 C), resp. rate (!) 24, height 5\' 10"  (1.778 m), weight 67.2 kg, SpO2 98 %. PAP: (29-44)/(9-25) 33/19  Vent Mode: PRVC FiO2 (%):  [100 %] 100 % Set Rate:  [24 bmp] 24 bmp Vt Set:  [580 mL] 580 mL PEEP:  [5 cmH20] 5 cmH20 Plateau Pressure:  [20 cmH20-26 cmH20] 20 cmH20   Intake/Output Summary (Last 24 hours) at 12/22/2019  12/26/2019 Last data filed at 12/28/2019 0800 Gross per 24 hour  Intake 8428.62 ml  Output 1446 ml  Net 6982.62 ml   Filed Weights   12/07/2019 2334 12/22/19 0530 12/23/19 0515  Weight: 53.2 kg 61.1 kg 67.2 kg    Examination: GEN: chronically ill man unresponsive HEENT: ETT in place, thick white secretions CV: Tachycardic, ext lukewarm PULM: Rhonci bilaterally, passive on vent GI: Soft, hypoactive BS EXT: Muscle wasting, global anasarca NEURO: no response to pain or voice, pupils are reactive, he has no corneals, no oculocephalic reflex, no cough/gag, he does trigger vent and does do flexion response with upper ext to deep suctioning PSYCH: cannot assess SKIN: ashen   Resolved Hospital Problem list   NA  Assessment & Plan:  Status post cardiac arrest: Initial rhythm V. fib with prolonged time from initial arrest to CPR.  CT Head not done for some reason.  Had emergent PCI with stent to RCA.  Lingering profound shock and ongoing burst suppression with intermittent seizures despite aggressive management.  Family electing for comfort care  Patient is donor so will be going to OR for palliative extubation and procurement  Family updated at bedside  31 minutes cc time  12/25/19 MD [facility] 12/25/2019 9:07 AM Personal pager: [phone] If unanswered, please page CCM On-call: [phone]

## 2019-12-30 NOTE — Progress Notes (Signed)
Patient was taken to OR with plans for organ procurement. Patient was given Heparin as ordered prior to extubation.  4mg  of Versed was given per orders with the heparin administration. Once extubated by respiratory therapist the vasopressors were stopped, drips stopped at 1101.  Propofol decreased and then turned off following the pressors being discontinued.  Fentanyl was infusing at the time of death and stopped once declared at 1126. IABP was pulled along with sheath and swan once declared. Family with patient in OR at time of passing.

## 2019-12-30 NOTE — Progress Notes (Signed)
Patient extubated and balloon pump turned off. Vasopressors and IV medications discontinued per order. Time of death 10/29/24. Verified by Jeannene Patella, RN and Rushie Chestnut, RN

## 2019-12-30 DEATH — deceased

## 2020-01-30 DEATH — deceased

## 2020-02-25 ENCOUNTER — Other Ambulatory Visit: Payer: Self-pay | Admitting: Family Medicine

## 2022-01-23 IMAGING — DX DG CHEST 1V PORT
1 series · 1 of 1 positions shown · non-contrast
Comparison: May 30, 2014.

CLINICAL DATA: Status post intubation.

EXAM:
PORTABLE CHEST 1 VIEW

[chest]
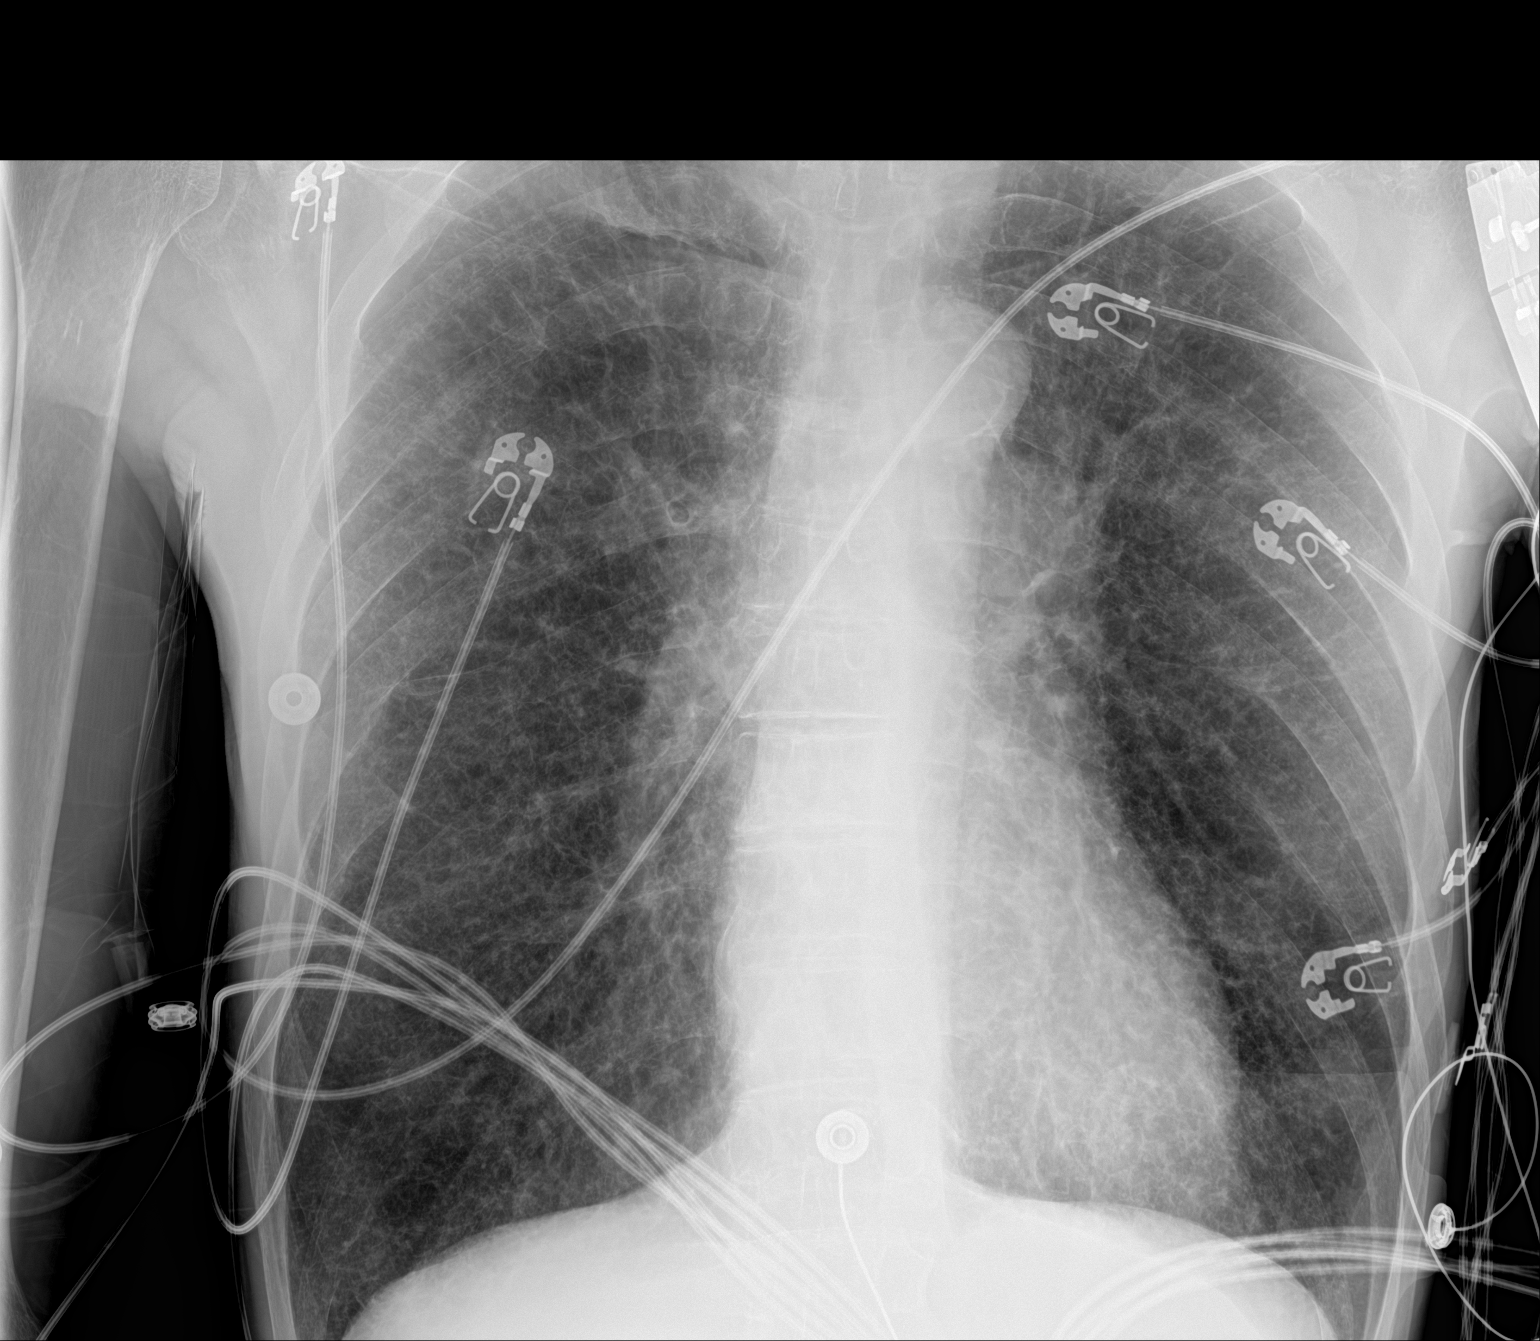

[1 of 1 positions shown; findings below may reference images not displayed]

FINDINGS: The heart size and mediastinal contours are within normal limits.
Endotracheal tube is seen in grossly good position. No pneumothorax
or pleural effusion is noted. Hyperexpansion of the lungs is noted.
Mild interstitial densities are noted throughout both lungs which
may represent scarring, but acute superimposed edema or inflammation
cannot be excluded. The visualized skeletal structures are
unremarkable.
IMPRESSION: Endotracheal tube in grossly good position. Hyperexpansion of the
lungs. Mild interstitial densities are noted throughout both lungs
which may represent scarring, but acute superimposed edema or
inflammation cannot be excluded.

## 2022-01-27 IMAGING — CR DG OR LOCAL ABDOMEN
1 series · 1 of 1 positions shown · non-contrast
Comparison: 12/20/2019

CLINICAL DATA: Organ procurement.

EXAM:
OR LOCAL ABDOMEN

[AP]
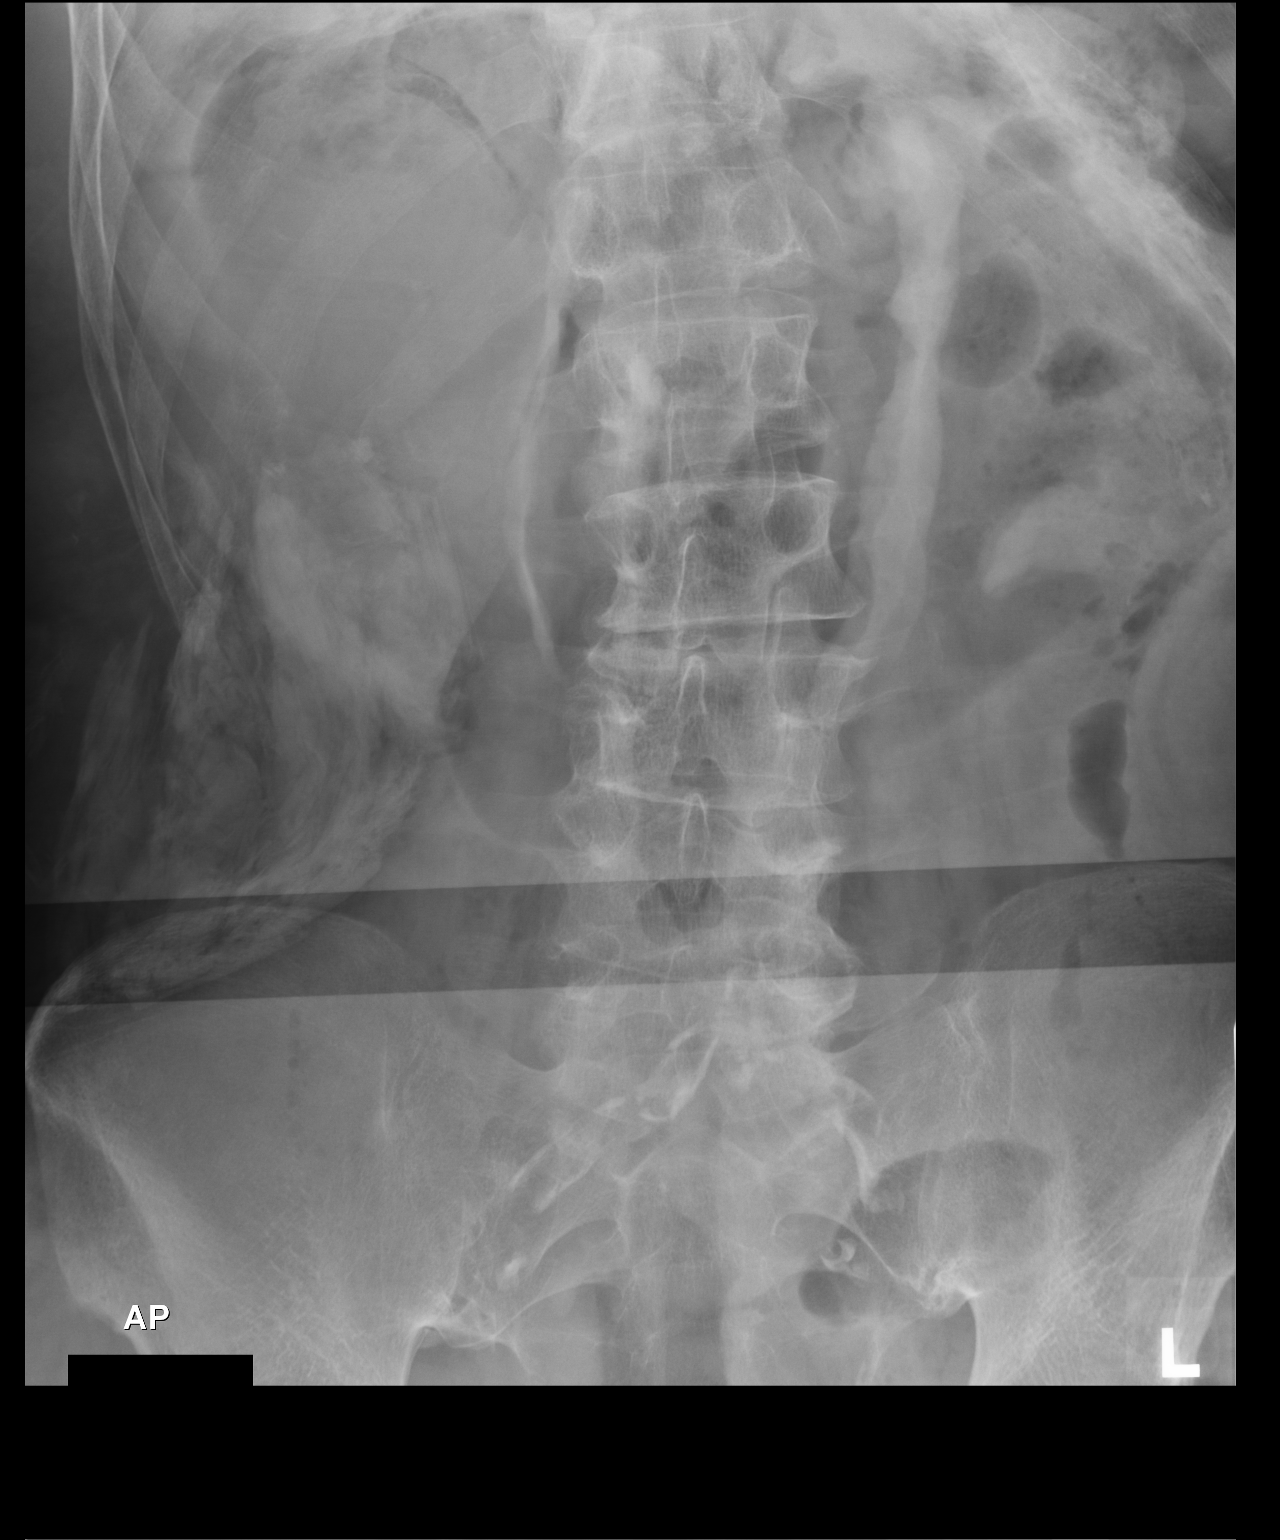

[1 of 1 positions shown; findings below may reference images not displayed]

FINDINGS: Postsurgical changes from bilateral kidney harvesting noted. No
metallic radiopaque foreign bodies noted within the abdomen or
pelvis. Atherosclerotic calcifications are identified within the
branch vessels of the abdominal aorta.
IMPRESSION: No evidence for metallic foreign bodies.
